# Patient Record
Sex: Female | Born: 1995 | Race: White | Hispanic: No | Marital: Single | Smoking: Never smoker
Health system: Southern US, Community
[De-identification: ages and names within clinical notes are randomized; demographics above are authoritative.]

## PROBLEM LIST (undated history)

## (undated) DIAGNOSIS — F329 Major depressive disorder, single episode, unspecified: Secondary | ICD-10-CM

## (undated) DIAGNOSIS — F32A Depression, unspecified: Secondary | ICD-10-CM

## (undated) DIAGNOSIS — F419 Anxiety disorder, unspecified: Secondary | ICD-10-CM

## (undated) HISTORY — PX: OTHER SURGICAL HISTORY: SHX169

## (undated) HISTORY — DX: Major depressive disorder, single episode, unspecified: F32.9

## (undated) HISTORY — DX: Depression, unspecified: F32.A

## (undated) HISTORY — DX: Anxiety disorder, unspecified: F41.9

---
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
## 2016-02-07 ENCOUNTER — Ambulatory Visit: Payer: BLUE CROSS/BLUE SHIELD | Admitting: Certified Nurse Midwife

## 2016-02-13 ENCOUNTER — Encounter: Payer: Self-pay | Admitting: Certified Nurse Midwife

## 2016-02-13 ENCOUNTER — Ambulatory Visit (INDEPENDENT_AMBULATORY_CARE_PROVIDER_SITE_OTHER): Payer: BLUE CROSS/BLUE SHIELD | Admitting: Certified Nurse Midwife

## 2016-02-13 VITALS — BP 110/62 | HR 80 | Resp 20 | Ht 63.25 in | Wt 160.0 lb

## 2016-02-13 DIAGNOSIS — Z3041 Encounter for surveillance of contraceptive pills: Secondary | ICD-10-CM

## 2016-02-13 DIAGNOSIS — Z01419 Encounter for gynecological examination (general) (routine) without abnormal findings: Secondary | ICD-10-CM | POA: Diagnosis not present

## 2016-02-13 MED ORDER — JUNEL FE 1/20 1-20 MG-MCG PO TABS
1.0000 | ORAL_TABLET | Freq: Every day | ORAL | 4 refills | Status: DC
Start: 2016-02-13 — End: 2017-03-24

## 2016-02-13 NOTE — Progress Notes (Signed)
20 y.o. G0P0000 Single  Caucasian Fe here for annual exam. Periods normal, no issues. Contraception working well. No missed pills or periods. Sexually active, no partner change for 2 years. No STD screening needed. Sees urgent care if needed. No health issues today. 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   Patient's last menstrual period was 01/28/2016 (exact date).          Sexually active: Yes.    The current method of family planning is OCP (estrogen/progesterone).    Exercising: No.  exercise Smoker:  no  Health Maintenance: Pap:  none MMG:  none Colonoscopy:  none BMD:   none TDaP:  2009 Shingles: no Pneumonia: no Hep C and HIV: not done Labs: none Self breast exam: not done   reports that she has never smoked. She has never used smokeless tobacco. She reports that she does not drink alcohol or use drugs.  Past Medical History:  Diagnosis Date  . Anxiety   . Depression     Past Surgical History:  Procedure Laterality Date  . Anxiety    . WISDOM TOOTH EXTRACTION Bilateral 2016    Current Outpatient Prescriptions  Medication Sig Dispense Refill  . JUNEL FE 1/20 1-20 MG-MCG tablet Take 1 tablet by mouth daily. 1 Package 0   No current facility-administered medications for this visit.     Family History  Problem Relation Age of Onset  . Diabetes Maternal Grandmother   . Thyroid disease Paternal Grandmother   . Diabetes Paternal Grandfather   . Stroke Paternal Grandfather     ROS:  Pertinent items are noted in HPI.  Otherwise, a comprehensive ROS was negative.  Exam:   BP 110/62   Pulse 80   Resp 20   Ht 5' 3.25" (1.607 m)   Wt 160 lb (72.6 kg)   LMP 01/28/2016 (Exact Date)   BMI 28.12 kg/m  Height: 5' 3.25" (160.7 cm) Ht Readings from Last 3 Encounters:  02/13/16 5' 3.25" (1.607 m) (34 %, Z= -0.40)*  02/01/15 5' 3.75" (1.619 m) (43 %, Z= -0.18)*  03/27/14 5' 3.5" (1.613 m) (40 %, Z= -0.25)*   * Growth percentiles are based on CDC 2-20  Years data.    General appearance: alert, cooperative and appears stated age Head: Normocephalic, without obvious abnormality, atraumatic Neck: no adenopathy, supple, symmetrical, trachea midline and thyroid normal to inspection and palpation Lungs: clear to auscultation bilaterally Breasts: normal appearance, no masses or tenderness, No nipple retraction or dimpling, No nipple discharge or bleeding, No axillary or supraclavicular adenopathy, Taught monthly breast self examination Heart: regular rate and rhythm Abdomen: soft, non-tender; no masses,  no organomegaly Extremities: extremities normal, atraumatic, no cyanosis or edema Skin: Skin color, texture, turgor normal. No rashes or lesions Lymph nodes: Cervical, supraclavicular, and axillary nodes normal. No abnormal inguinal nodes palpated Neurologic: Grossly normal   Pelvic: External genitalia:  no lesions              Urethra:  normal appearing urethra with no masses, tenderness or lesions              Bartholin's and Skene's: normal                 Vagina: normal appearing vagina with normal color and discharge, no lesions              Cervix: no lesions, nulliparous appearance and normal  Pap taken: No. Bimanual Exam:  Uterus:  normal size, contour, position, consistency, mobility, non-tender and anteverted              Adnexa: normal adnexa and no mass, fullness, tenderness               Rectovaginal: Confirms               Anus:  normal appearance  Chaperone present: yes  A:  Well Woman with normal exam  Contraception OCP desired    P:   Reviewed health and wellness pertinent to exam  Rx Junel 1/20 see order with instructions  Pap smear as above not taken   counseled on breast self exam, STD prevention, HIV risk factors and prevention, use and side effects of OCP's, adequate intake of calcium and vitamin D, diet and exercise  return annually or prn  An After Visit Summary was printed and given to the  patient.

## 2016-02-13 NOTE — Patient Instructions (Signed)
General topics  Next pap or exam is  due in 1 year Take a Women's multivitamin Take 1200 mg. of calcium daily - prefer dietary If any concerns in interim to call back  Breast Self-Awareness Practicing breast self-awareness may pick up problems early, prevent significant medical complications, and possibly save your life. By practicing breast self-awareness, you can become familiar with how your breasts look and feel and if your breasts are changing. This allows you to notice changes early. It can also offer you some reassurance that your breast health is good. One way to learn what is normal for your breasts and whether your breasts are changing is to do a breast self-exam. If you find a lump or something that was not present in the past, it is best to contact your caregiver right away. Other findings that should be evaluated by your caregiver include nipple discharge, especially if it is bloody; skin changes or reddening; areas where the skin seems to be pulled in (retracted); or new lumps and bumps. Breast pain is seldom associated with cancer (malignancy), but should also be evaluated by a caregiver. BREAST SELF-EXAM The best time to examine your breasts is 5 7 days after your menstrual period is over.  ExitCare Patient Information 2013 [employer].   Exercise to Stay Healthy Exercise helps you become and stay healthy. EXERCISE IDEAS AND TIPS Choose exercises that:  You enjoy.  Fit into your day. You do not need to exercise really hard to be healthy. You can do exercises at a slow or medium level and stay healthy. You can:  Stretch before and after working out.  Try yoga, Pilates, or tai chi.  Lift weights.  Walk fast, swim, jog, run, climb stairs, bicycle, dance, or rollerskate.  Take aerobic classes. Exercises that burn about 150 calories:  Running 1  miles in 15 minutes.  Playing volleyball for 45 to 60 minutes.  Washing and waxing a car for 45 to 60  minutes.  Playing touch football for 45 minutes.  Walking 1  miles in 35 minutes.  Pushing a stroller 1  miles in 30 minutes.  Playing basketball for 30 minutes.  Raking leaves for 30 minutes.  Bicycling 5 miles in 30 minutes.  Walking 2 miles in 30 minutes.  Dancing for 30 minutes.  Shoveling snow for 15 minutes.  Swimming laps for 20 minutes.  Walking up stairs for 15 minutes.  Bicycling 4 miles in 15 minutes.  Gardening for 30 to 45 minutes.  Jumping rope for 15 minutes.  Washing windows or floors for 45 to 60 minutes. Document Released: 06/27/2010 Document Revised: 08/17/2011 Document Reviewed: 06/27/2010 ExitCare Patient Information 2013 [employer].   Other topics ( that may be useful information):    Sexually Transmitted Disease Sexually transmitted disease (STD) refers to any infection that is passed from person to person during sexual activity. This may happen by way of saliva, semen, blood, vaginal mucus, or urine. Common STDs include:  Gonorrhea.  Chlamydia.  Syphilis.  HIV/AIDS.  Genital herpes.  Hepatitis B and C.  Trichomonas.  Human papillomavirus (HPV).  Pubic lice. CAUSES  An STD may be spread by bacteria, virus, or parasite. A person can get an STD by:  Sexual intercourse with an infected person.  Sharing sex toys with an infected person.  Sharing needles with an infected person.  Having intimate contact with the genitals, mouth, or rectal areas of an infected person. SYMPTOMS  Some people may not have any symptoms, but   they can still pass the infection to others. Different STDs have different symptoms. Symptoms include:  Painful or bloody urination.  Pain in the pelvis, abdomen, vagina, anus, throat, or eyes.  Skin rash, itching, irritation, growths, or sores (lesions). These usually occur in the genital or anal area.  Abnormal vaginal discharge.  Penile discharge in men.  Soft, flesh-colored skin growths in the  genital or anal area.  Fever.  Pain or bleeding during sexual intercourse.  Swollen glands in the groin area.  Yellow skin and eyes (jaundice). This is seen with hepatitis. DIAGNOSIS  To make a diagnosis, your caregiver may:  Take a medical history.  Perform a physical exam.  Take a specimen (culture) to be examined.  Examine a sample of discharge under a microscope.  Perform blood test TREATMENT   Chlamydia, gonorrhea, trichomonas, and syphilis can be cured with antibiotic medicine.  Genital herpes, hepatitis, and HIV can be treated, but not cured, with prescribed medicines. The medicines will lessen the symptoms.  Genital warts from HPV can be treated with medicine or by freezing, burning (electrocautery), or surgery. Warts may come back.  HPV is a virus and cannot be cured with medicine or surgery.However, abnormal areas may be followed very closely by your caregiver and may be removed from the cervix, vagina, or vulva through office procedures or surgery. If your diagnosis is confirmed, your recent sexual partners need treatment. This is true even if they are symptom-free or have a negative culture or evaluation. They should not have sex until their caregiver says it is okay. HOME CARE INSTRUCTIONS  All sexual partners should be informed, tested, and treated for all STDs.  Take your antibiotics as directed. Finish them even if you start to feel better.  Only take over-the-counter or prescription medicines for pain, discomfort, or fever as directed by your caregiver.  Rest.  Eat a balanced diet and drink enough fluids to keep your urine clear or pale yellow.  Do not have sex until treatment is completed and you have followed up with your caregiver. STDs should be checked after treatment.  Keep all follow-up appointments, Pap tests, and blood tests as directed by your caregiver.  Only use latex condoms and water-soluble lubricants during sexual activity. Do not use  petroleum jelly or oils.  Avoid alcohol and illegal drugs.  Get vaccinated for HPV and hepatitis. If you have not received these vaccines in the past, talk to your caregiver about whether one or both might be right for you.  Avoid risky sex practices that can break the skin. The only way to avoid getting an STD is to avoid all sexual activity.Latex condoms and dental dams (for oral sex) will help lessen the risk of getting an STD, but will not completely eliminate the risk. SEEK MEDICAL CARE IF:   You have a fever.  You have any new or worsening symptoms. Document Released: 08/15/2002 Document Revised: 08/17/2011 Document Reviewed: 08/22/2010 [facility] Patient Information 2013 [employer].    Domestic Abuse You are being battered or abused if someone close to you hits, pushes, or physically hurts you in any way. You also are being abused if you are forced into activities. You are being sexually abused if you are forced to have sexual contact of any kind. You are being emotionally abused if you are made to feel worthless or if you are constantly threatened. It is important to remember that help is available. No one has the right to abuse you. PREVENTION OF FURTHER  ABUSE  Learn the warning signs of danger. This varies with situations but may include: the use of alcohol, threats, isolation from friends and family, or forced sexual contact. Leave if you feel that violence is going to occur.  If you are attacked or beaten, report it to the police so the abuse is documented. You do not have to press charges. The police can protect you while you or the attackers are leaving. Get the officer's name and badge number and a copy of the report.  Find someone you can trust and tell them what is happening to you: your caregiver, a nurse, clergy member, close friend or family member. Feeling ashamed is natural, but remember that you have done nothing wrong. No one deserves abuse. Document Released:  05/22/2000 Document Revised: 08/17/2011 Document Reviewed: 07/31/2010 ExitCare Patient Information 2013 [employer].    How Much is Too Much Alcohol? Drinking too much alcohol can cause injury, accidents, and health problems. These types of problems can include:   Car crashes.  Falls.  Family fighting (domestic violence).  Drowning.  Fights.  Injuries.  Burns.  Damage to certain organs.  Having a baby with birth defects. ONE DRINK CAN BE TOO MUCH WHEN YOU ARE:  Working.  Pregnant or breastfeeding.  Taking medicines. Ask your doctor.  Driving or planning to drive. If you or someone you know has a drinking problem, get help from a doctor.  Document Released: 03/21/2009 Document Revised: 08/17/2011 Document Reviewed: 03/21/2009 ExitCare Patient Information 2013 [employer].   Smoking Hazards Smoking cigarettes is extremely bad for your health. Tobacco smoke has over 200 known poisons in it. There are over 60 chemicals in tobacco smoke that cause cancer. Some of the chemicals found in cigarette smoke include:   Cyanide.  Benzene.  Formaldehyde.  Methanol (wood alcohol).  Acetylene (fuel used in welding torches).  Ammonia. Cigarette smoke also contains the poisonous gases nitrogen oxide and carbon monoxide.  Cigarette smokers have an increased risk of many serious medical problems and Smoking causes approximately:  90% of all lung cancer deaths in men.  80% of all lung cancer deaths in women.  90% of deaths from chronic obstructive lung disease. Compared with nonsmokers, smoking increases the risk of:  Coronary heart disease by 2 to 4 times.  Stroke by 2 to 4 times.  Men developing lung cancer by 23 times.  Women developing lung cancer by 13 times.  Dying from chronic obstructive lung diseases by 12 times.  . Smoking is the most preventable cause of death and disease in our society.  WHY IS SMOKING ADDICTIVE?  Nicotine is the chemical  agent in tobacco that is capable of causing addiction or dependence.  When you smoke and inhale, nicotine is absorbed rapidly into the bloodstream through your lungs. Nicotine absorbed through the lungs is capable of creating a powerful addiction. Both inhaled and non-inhaled nicotine may be addictive.  Addiction studies of cigarettes and spit tobacco show that addiction to nicotine occurs mainly during the teen years, when young people begin using tobacco products. WHAT ARE THE BENEFITS OF QUITTING?  There are many health benefits to quitting smoking.   Likelihood of developing cancer and heart disease decreases. Health improvements are seen almost immediately.  Blood pressure, pulse rate, and breathing patterns start returning to normal soon after quitting. QUITTING SMOKING   American Lung Association - 1-800-LUNGUSA  American Cancer Society - 1-800-ACS-2345 Document Released: 07/02/2004 Document Revised: 08/17/2011 Document Reviewed: 03/06/2009 ExitCare Patient Information 2013 ExitCare,   LLC.   Stress Management Stress is a state of physical or mental tension that often results from changes in your life or normal routine. Some common causes of stress are:  Death of a loved one.  Injuries or severe illnesses.  Getting fired or changing jobs.  Moving into a new home. Other causes may be:  Sexual problems.  Business or financial losses.  Taking on a large debt.  Regular conflict with someone at home or at work.  Constant tiredness from lack of sleep. It is not just bad things that are stressful. It may be stressful to:  Win the lottery.  Get married.  Buy a new car. The amount of stress that can be easily tolerated varies from person to person. Changes generally cause stress, regardless of the types of change. Too much stress can affect your health. It may lead to physical or emotional problems. Too little stress (boredom) may also become stressful. SUGGESTIONS TO  REDUCE STRESS:  Talk things over with your family and friends. It often is helpful to share your concerns and worries. If you feel your problem is serious, you may want to get help from a professional counselor.  Consider your problems one at a time instead of lumping them all together. Trying to take care of everything at once may seem impossible. List all the things you need to do and then start with the most important one. Set a goal to accomplish 2 or 3 things each day. If you expect to do too many in a single day you will naturally fail, causing you to feel even more stressed.  Do not use alcohol or drugs to relieve stress. Although you may feel better for a short time, they do not remove the problems that caused the stress. They can also be habit forming.  Exercise regularly - at least 3 times per week. Physical exercise can help to relieve that "uptight" feeling and will relax you.  The shortest distance between despair and hope is often a good night's sleep.  Go to bed and get up on time allowing yourself time for appointments without being rushed.  Take a short "time-out" period from any stressful situation that occurs during the day. Close your eyes and take some deep breaths. Starting with the muscles in your face, tense them, hold it for a few seconds, then relax. Repeat this with the muscles in your neck, shoulders, hand, stomach, back and legs.  Take good care of yourself. Eat a balanced diet and get plenty of rest.  Schedule time for having fun. Take a break from your daily routine to relax. HOME CARE INSTRUCTIONS   Call if you feel overwhelmed by your problems and feel you can no longer manage them on your own.  Return immediately if you feel like hurting yourself or someone else. Document Released: 11/18/2000 Document Revised: 08/17/2011 Document Reviewed: 07/11/2007 ExitCare Patient Information 2013 ExitCare, [employer].   

## 2016-02-14 NOTE — Progress Notes (Signed)
Encounter reviewed Jill Jertson, MD   

## 2016-04-10 ENCOUNTER — Telehealth: Payer: Self-pay | Admitting: Certified Nurse Midwife

## 2016-04-10 NOTE — Telephone Encounter (Signed)
Patient having pain inside vagina and some on the outside.  Says it feels raw or cut. No rash or redness.  Says " I'm not having sex like that so I don't know what could be causing this".  Says symptoms come and go.

## 2016-04-10 NOTE — Telephone Encounter (Signed)
Spoke with patient. Patient states she has "tiny scrapes" on outside of vagina and feels like "cuts" internally. Patient states this happens intermittently -coming and going for a week at a time. Patient states no pelvic pain but due to the "scapes" intercourse is painful. Patient denies vaginal discharge, odor, bleeding, fever or pelvic pain. Patient states she has been with same partner for 3 years now and last had intercourse 2 weeks ago. Patient states menses have been regular. Patient states this has been going on for last few years. Recommended OV for today for further evaluation, patient declined due to work schedule. Advised patient Melvia Heaps, CNM out of office 04/13/16, can schedule with covering provider; patient scheduled with Dr. Quincy Simmonds at 11:30am. Advised patient should symptoms worsen to seek care at ER/urgent care, patient verbalizes understanding and is agreeable.   Melvia Heaps, CNM do you agree with recommendations?   Cc: Dr. Quincy Simmonds

## 2016-04-10 NOTE — Telephone Encounter (Signed)
agree

## 2016-04-13 ENCOUNTER — Encounter: Payer: Self-pay | Admitting: Obstetrics and Gynecology

## 2016-04-13 ENCOUNTER — Ambulatory Visit (INDEPENDENT_AMBULATORY_CARE_PROVIDER_SITE_OTHER): Payer: BLUE CROSS/BLUE SHIELD | Admitting: Obstetrics and Gynecology

## 2016-04-13 VITALS — BP 106/70 | HR 68 | Temp 98.5°F | Resp 16 | Ht 63.75 in | Wt 155.0 lb

## 2016-04-13 DIAGNOSIS — N762 Acute vulvitis: Secondary | ICD-10-CM | POA: Diagnosis not present

## 2016-04-13 NOTE — Progress Notes (Addendum)
GYNECOLOGY  VISIT   HPI: 20 y.o.   Single  Caucasian  female   G0P0000 with Patient's last menstrual period was 03/31/2016.   here for vaginal lesions, vaginal irritation, raw places, vaginal pain since 1 year per patient.  Symptoms for one year.  Shows me a picture from her cell phone.  (Looks like potential HSV lesions. ) States she has been testing in the past and had been negative for this.   Has raw places occur externally and it hurts to have sex during this time.  Occurs at least once a month and they last for one week or more.  "It hurts terribly."  Sometimes itching.  No odor.  Denies inguinal pain.   No change in soaps or detergents.   No change in partners.   No condom use.   No recurrent use of abx.  Menses regular once a month.   GYNECOLOGIC HISTORY: Patient's last menstrual period was 03/31/2016. Contraception:  JUNEL FE  Menopausal hormone therapy:  n/a Last mammogram:  n/a Last pap smear:   n/a        OB History    Gravida Para Term Preterm AB Living   0 0 0 0 0 0   SAB TAB Ectopic Multiple Live Births   0 0 0 0           There are no active problems to display for this patient.   Past Medical History:  Diagnosis Date  . Anxiety   . Depression     Past Surgical History:  Procedure Laterality Date  . Anxiety    . WISDOM TOOTH EXTRACTION Bilateral 2016    Current Outpatient Prescriptions  Medication Sig Dispense Refill  . JUNEL FE 1/20 1-20 MG-MCG tablet Take 1 tablet by mouth daily. 3 Package 4   No current facility-administered medications for this visit.      ALLERGIES: Patient has no known allergies.  Family History  Problem Relation Age of Onset  . Diabetes Maternal Grandmother   . Thyroid disease Paternal Grandmother   . Diabetes Paternal Grandfather   . Stroke Paternal Grandfather     Social History   Social History  . Marital status: Single    Spouse name: N/A  . Number of children: N/A  . Years of education: N/A    Occupational History  . Not on file.   Social History Main Topics  . Smoking status: Never Smoker  . Smokeless tobacco: Never Used  . Alcohol use No  . Drug use: No  . Sexual activity: Yes    Partners: Male    Birth control/ protection: Pill   Other Topics Concern  . Not on file   Social History Narrative  . No narrative on file    ROS:  Pertinent items are noted in HPI.  PHYSICAL EXAMINATION:    BP 106/70 (BP Location: Right Arm, Patient Position: Sitting, Cuff Size: Normal)   Pulse 68   Temp 98.5 F (36.9 C) (Oral)   Resp 16   Ht 5' 3.75" (1.619 m)   Wt 155 lb (70.3 kg)   LMP 03/31/2016   BMI 26.81 kg/m     General appearance: alert, cooperative and appears stated age    Pelvic: External genitalia:   Erythema of the vulva - labia minor and minora.  Subtle lesion of the left inferior labia majora/minora border.               Urethra:  normal appearing urethra with no  masses, tenderness or lesions              Bartholins and Skenes: normal                 Vagina: normal appearing vagina with normal color and discharge, no lesions              Cervix: no lesions                Bimanual Exam:  Uterus:  normal size, contour, position, consistency, mobility, non-tender              Adnexa: no mass, fullness, tenderness   Chaperone was present for exam.  ASSESSMENT  Vulvitis.  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  May have superimposed yeast infection.   PLAN  Affirm.  HSV culture done.  HSV I and II IgG.  She will return to do this as the lab is closed.  We discussed treatment for yeast and HSV, but she wants to wait for the tet results to return.  We talked about daily antiviral medication to suppress outbreaks.  If patient is positive, I would recommend full STD testing and partner to be tested as well.     An After Visit Summary was printed and given to the patient.  _15_____ minutes face to face time of which over 50% was spent in counseling.

## 2016-04-13 NOTE — Addendum Note (Signed)
Addended by: Yisroel Ramming, Dietrich Pates E on: 04/13/2016 12:23 PM   Modules accepted: Orders

## 2016-04-14 ENCOUNTER — Telehealth: Payer: Self-pay | Admitting: *Deleted

## 2016-04-14 ENCOUNTER — Other Ambulatory Visit (INDEPENDENT_AMBULATORY_CARE_PROVIDER_SITE_OTHER): Payer: BLUE CROSS/BLUE SHIELD

## 2016-04-14 DIAGNOSIS — N762 Acute vulvitis: Secondary | ICD-10-CM

## 2016-04-14 LAB — WET PREP BY MOLECULAR PROBE
Candida species: POSITIVE — AB
Gardnerella vaginalis: NEGATIVE
TRICHOMONAS VAG: NEGATIVE

## 2016-04-14 MED ORDER — FLUCONAZOLE 150 MG PO TABS: ORAL_TABLET | ORAL | 0 refills | Status: DC

## 2016-04-14 NOTE — Telephone Encounter (Signed)
-----   Message from Nunzio Cobbs, MD sent at 04/14/2016 12:04 PM EST ----- Please let patient know that she does have a yeast infection.  I am recommending Diflucan 150 mg po x 1 and repeat in 72 hours prn.  Dispense - 2, RF none.   HSV culture and serum testing is pending.   Cc- Sagal Sprinkles

## 2016-04-14 NOTE — Telephone Encounter (Signed)
Left message to call Issis Lindseth at [phone].  

## 2016-04-14 NOTE — Telephone Encounter (Signed)
Spoke with patient, advised as seen below per Dr. Elza Rafter request. Patient verbalizes understanding and is agreeable. New order placed for Diflucan 150mg  #2/0RF.   Routing to provider for final review. Patient is agreeable to disposition. Will close encounter.

## 2016-04-15 ENCOUNTER — Encounter: Payer: Self-pay | Admitting: Obstetrics and Gynecology

## 2016-04-15 LAB — HSV(HERPES SIMPLEX VRS) I + II AB-IGG: HSV 2 Glycoprotein G Ab, IgG: <0.9 Index (ref <0.90)

## 2016-04-15 LAB — HERPES SIMPLEX VIRUS CULTURE: Organism ID, Bacteria: NOT DETECTED

## 2016-04-16 ENCOUNTER — Telehealth: Payer: Self-pay | Admitting: Obstetrics and Gynecology

## 2016-04-16 NOTE — Telephone Encounter (Signed)
Patient says she is returning a call to [place].(No open tc note)

## 2016-04-16 NOTE — Telephone Encounter (Signed)
Returned call to patient to review recent lab results -see note on 04/14/16 lab report. Will close encounter.

## 2016-07-16 ENCOUNTER — Telehealth: Payer: Self-pay | Admitting: Certified Nurse Midwife

## 2016-07-16 NOTE — Telephone Encounter (Signed)
Please schedule an appointment for patient to see Evalee Mutton. I am happy to see her if Jackelyn Poling is not available.  [place].

## 2016-07-16 NOTE — Telephone Encounter (Signed)
Patient has been seeing brown blood for 2 weeks before her period starts.

## 2016-07-16 NOTE — Telephone Encounter (Signed)
Spoke with patient. Patient states she has been having a brown discharge that she is not sure is discharge or blood for the last 2 weeks. Patient is on OCP and was due to start cycle on Tuesday, but has not started. Patient states she usually starts a new pack of OCP on Monday following cycle. Patient reports she took one pill out of new pack on Monday not thinking since she was having the brown discharge and realized still no cycle so she has not taken anymore OCP. Patient states she has taken 3 UPT today and reports all negative. Patient denies pain, other than normal menstrual cramps. Patient denies odor, urinary complaints or STD concerns. Patient states no partner changes since last OV. Patient states she has not had any vaginal lesions since last OV. Advised patient would review with Dr. Quincy Simmonds and return call with recommendations, patient is agreeable.   Dr. Quincy Simmonds -please advise?

## 2016-07-17 NOTE — Telephone Encounter (Signed)
Call to patient. Advised Dr Quincy Simmonds reviewed call and recommends office visit. Patient agreeable and requests appointment next Thursday. Advised would prefer she be seen earlier than Thursday if possible. Patient agreeable to Tuesday am but states she absolutely cannot come today or Monday due to class and work.  Advised if bleeding gets worse and becomes heavy or develops pain to call back to office (if open) or go to urgent care, [facility], or  nearest ED for evaluation. Appointment scheduled for Tuesday 07-21-16 at 930 with French Ana, CNM.  Encounter closed.  CC: French Ana, CNM

## 2016-07-21 ENCOUNTER — Ambulatory Visit (INDEPENDENT_AMBULATORY_CARE_PROVIDER_SITE_OTHER): Payer: BLUE CROSS/BLUE SHIELD | Admitting: Certified Nurse Midwife

## 2016-07-21 ENCOUNTER — Encounter: Payer: Self-pay | Admitting: Certified Nurse Midwife

## 2016-07-21 VITALS — BP 110/70 | HR 60 | Resp 12 | Ht 63.75 in | Wt 150.6 lb

## 2016-07-21 DIAGNOSIS — N926 Irregular menstruation, unspecified: Secondary | ICD-10-CM

## 2016-07-21 DIAGNOSIS — N898 Other specified noninflammatory disorders of vagina: Secondary | ICD-10-CM | POA: Diagnosis not present

## 2016-07-21 LAB — POCT URINE PREGNANCY: Preg Test, Ur: NEGATIVE

## 2016-07-21 NOTE — Patient Instructions (Signed)
Oral Contraception Use Oral contraceptive pills (OCPs) are medicines taken to prevent pregnancy. OCPs work by preventing the ovaries from releasing eggs. The hormones in OCPs also cause the cervical mucus to thicken, preventing the sperm from entering the uterus. The hormones also cause the uterine lining to become thin, not allowing a fertilized egg to attach to the inside of the uterus. OCPs are highly effective when taken exactly as prescribed. However, OCPs do not prevent sexually transmitted diseases (STDs). Safe sex practices, such as using condoms along with an OCP, can help prevent STDs. Before taking OCPs, you may have a physical exam and Pap test. Your health care provider may also order blood tests if necessary. Your health care provider will make sure you are a good candidate for oral contraception. Discuss with your health care provider the possible side effects of the OCP you may be prescribed. When starting an OCP, it can take 2 to 3 months for the body to adjust to the changes in hormone levels in your body. How to take oral contraceptive pills Your health care provider may advise you on how to start taking the first cycle of OCPs. Otherwise, you can:  Start on day 1 of your menstrual period. You will not need any backup contraceptive protection with this start time.  Start on the first Sunday after your menstrual period or the day you get your prescription. In these cases, you will need to use backup contraceptive protection for the first week.  Start the pill at any time of your cycle. If you take the pill within 5 days of the start of your period, you are protected against pregnancy right away. In this case, you will not need a backup form of birth control. If you start at any other time of your menstrual cycle, you will need to use another form of birth control for 7 days. If your OCP is the type called a minipill, it will protect you from pregnancy after taking it for 2 days (48  hours).  After you have started taking OCPs:  If you forget to take 1 pill, take it as soon as you remember. Take the next pill at the regular time.  If you miss 2 or more pills, call your health care provider because different pills have different instructions for missed doses. Use backup birth control until your next menstrual period starts.  If you use a 28-day pack that contains inactive pills and you miss 1 of the last 7 pills (pills with no hormones), it will not matter. Throw away the rest of the non-hormone pills and start a new pill pack.  No matter which day you start the OCP, you will always start a new pack on that same day of the week. Have an extra pack of OCPs and a backup contraceptive method available in case you miss some pills or lose your OCP pack. Follow these instructions at home:  Do not smoke.  Always use a condom to protect against STDs. OCPs do not protect against STDs.  Use a calendar to mark your menstrual period days.  Read the information and directions that came with your OCP. Talk to your health care provider if you have questions. Contact a health care provider if:  You develop nausea and vomiting.  You have abnormal vaginal discharge or bleeding.  You develop a rash.  You miss your menstrual period.  You are losing your hair.  You need treatment for mood swings or depression.  You   get dizzy when taking the OCP.  You develop acne from taking the OCP.  You become pregnant. Get help right away if:  You develop chest pain.  You develop shortness of breath.  You have an uncontrolled or severe headache.  You develop numbness or slurred speech.  You develop visual problems.  You develop pain, redness, and swelling in the legs. This information is not intended to replace advice given to you by your health care provider. Make sure you discuss any questions you have with your health care provider. Document Released: 05/14/2011 Document  Revised: 10/31/2015 Document Reviewed: 11/13/2012 Elsevier Interactive Patient Education  2017 [employer]  

## 2016-07-21 NOTE — Progress Notes (Signed)
Encounter reviewed Jill Jertson, MD   

## 2016-07-21 NOTE — Progress Notes (Signed)
21 y.o. Single Caucasian female G0P0000 here with complaint of ?break through bleeding with OCP's Some brownish discharge with wiping about a week before menses was to start, which only a few days of spotting prior is normal for her.. Increase red color when menses actually started, but not excessive. No cramping or vaginal odor.Consistent pill use. Started first pill of pack with early onset and realized not time for period, so needs to start OCP now .Onset of symptoms 2 weeks ago. Denies vaginal symptoms or pain with sexual activity.No STD concerns. Urinary symptoms, frequency but on weight loss and drinking more.. Patient states has been drinking a lot of coffee lately due to coffee maker, also is now exercising regular with 10 pound weight loss.. Contraception is Junel 1/20 with no change in generic.No STD concerns  UPT: Negative  O:  Healthy female WDWN Affect: normal, orientation x 3  Exam:Skin: warm and dry Thyroid: normal, no enlargement Abdomen:not tender, soft.  Inguinal Lymph nodes: no enlargement or tenderness Pelvic exam: External genital: normal female BUS: negative Vagina: normal discharge.  Affirm taken. Cervix: normal, non tender, no CMT, no blood present from cervix noted Uterus: normal, non tender Adnexa:normal, non tender, no masses or fullness noted    A:? BTB with Junel 1/20 Fe vs cycle change with weight change. Normal pelvic exam R/O vaginal infection   P:Discussed findings of normal pelvic exam and maybe related to just change in cycle regarding weight change and one pill out pack use early also. Discussed starting OCP today, one day late and use BUM with condoms for the rest of the month. Continue menses calendar, if occurs again next month, may need to change dose of OCP for better endometrial support..Questions addressed. Patient will advise. Lab: Affirm Will treat if indicated.  Rv prn

## 2016-07-22 ENCOUNTER — Telehealth: Payer: Self-pay

## 2016-07-22 LAB — WET PREP BY MOLECULAR PROBE
CANDIDA SPECIES: NEGATIVE
GARDNERELLA VAGINALIS: POSITIVE — AB
TRICHOMONAS VAG: NEGATIVE

## 2016-07-22 MED ORDER — METRONIDAZOLE 0.75 % VA GEL: 1.0000 | Freq: Two times a day (BID) | VAGINAL | 0 refills | Status: AC

## 2016-07-22 NOTE — Telephone Encounter (Signed)
-----   Message from Regina Eck, CNM sent at 07/22/2016  2:52 PM EST ----- Notify patient that her wet prep was negative for yeast, trichomonas. Positive for BV which could contribute to spotting. Rx Metrogel bid x 5 days. No refills

## 2016-07-22 NOTE — Telephone Encounter (Signed)
Per Jackelyn Poling, Metrogel bid x5 called into pharmacy. Patient verified pharmacy on file.

## 2016-08-13 ENCOUNTER — Ambulatory Visit (INDEPENDENT_AMBULATORY_CARE_PROVIDER_SITE_OTHER): Payer: BLUE CROSS/BLUE SHIELD | Admitting: Certified Nurse Midwife

## 2016-08-13 ENCOUNTER — Encounter: Payer: Self-pay | Admitting: Certified Nurse Midwife

## 2016-08-13 ENCOUNTER — Telehealth: Payer: Self-pay | Admitting: Certified Nurse Midwife

## 2016-08-13 VITALS — BP 100/64 | HR 60 | Resp 16 | Ht 63.75 in | Wt 148.0 lb

## 2016-08-13 DIAGNOSIS — B3731 Acute candidiasis of vulva and vagina: Secondary | ICD-10-CM

## 2016-08-13 DIAGNOSIS — B373 Candidiasis of vulva and vagina: Secondary | ICD-10-CM | POA: Diagnosis not present

## 2016-08-13 MED ORDER — NYSTATIN-TRIAMCINOLONE 100000-0.1 UNIT/GM-% EX OINT: 1.0000 "application " | TOPICAL_OINTMENT | Freq: Two times a day (BID) | CUTANEOUS | 0 refills | Status: DC

## 2016-08-13 NOTE — Telephone Encounter (Signed)
The patient was instructed if she thought partner had issues with yeast there were over the counter options. She should have him follow OTC directions. I can not treat him, because he is not a patient here.

## 2016-08-13 NOTE — Telephone Encounter (Signed)
Left detailed message, ok per current dpr. Advised as seen below per Melvia Heaps, CNM. Advised patient to return call to office with any additional questions/concerns.  Routing to provider for final review. Patient is agreeable to disposition. Will close encounter.

## 2016-08-13 NOTE — Progress Notes (Signed)
Encounter reviewed Garnette Greb, MD   

## 2016-08-13 NOTE — Telephone Encounter (Signed)
Sonya Edwards, CNM- please advise on lotrimin AF for partner.

## 2016-08-13 NOTE — Progress Notes (Signed)
21 y.o. Single Caucasian female [id number] here with complaint of vaginal symptoms of cuts on external area of vulva and at the bottom of vaginal opening. "they will occur and feel sore for a few days and then will heal." No new personal products or partner change. Partner not complaining of any issues. Has this off and on for one year. Has been tested for HSV twice in 2015 and 2017 due to appearance and was negative. No change in vaginal symptoms. Patient does wear thongs, but no underwear at night. No oral sexual activity or hand stimulation. Used coconut oil after last visit after discussion with me regarding dryness. This helped with the internal symptoms.. No STD concerns. Urinary symptoms none . Contraception is OCP. No other health concerns today.  ROS  Pertinent to HPI  O:Healthy female WDWN Affect: normal, orientation x 3  Exam:Skin: warm and dry Abdomen: soft, non tender  Inguinal  Lymph nodes: no enlargement or tenderness Pelvic exam: External genital: normal female, with increase pink around lower 1/3 of vulva, no lesions or blisters, small superficial cracking noted on area below introitus, slightly tender to touch. Slight exudate noted.wet prep taken. BUS: negative Vagina: white slightly odorous discharge noted. , Affirm taken Cervix: normal, non tender, no CMT Uterus: normal, non tender Adnexa:normal, non tender, no masses or fullness noted   Wet Prep results: external area only KOH, Saline + yeast   A:Normal pelvic exam Yeast vulvitis Skin dryness and ? Thong irritation by appearance R/O vaginal infection   P:Discussed findings of yeast vulvitis and etiology. Discussed Aveeno or baking soda sitz bath for comfort. Avoid moist clothes or pads for extended period of time. If working out in gym clothes  long periods of time change underwear  if possible. Discussed avoiding thongs until this area has healed and non tender. Would also avoid sexual activity until healed. Coconut Oil  use for skin protection prior to activity can be used to external skin for protection or dryness. Rx Nystatin/Trimincinolone ointment bid x 5-7 days. Avoid over use due to skin thinning. Once completed use coconut oil to area for moisture and protection.  Lab: affirm will treat if indicated  Re check 2 weeks if no change  Rv prn

## 2016-08-13 NOTE — Telephone Encounter (Signed)
Patient saw Melvia Heaps this morning and was told to have her boyfriend use Lotrimin Af.  Wants to know how often he should use it.

## 2016-08-14 ENCOUNTER — Telehealth: Payer: Self-pay

## 2016-08-14 ENCOUNTER — Other Ambulatory Visit: Payer: Self-pay

## 2016-08-14 LAB — WET PREP BY MOLECULAR PROBE
Candida species: DETECTED — AB
Gardnerella vaginalis: NOT DETECTED
Trichomonas vaginosis: NOT DETECTED

## 2016-08-14 MED ORDER — FLUCONAZOLE 150 MG PO TABS: ORAL_TABLET | ORAL | 0 refills | Status: DC

## 2016-08-14 NOTE — Telephone Encounter (Signed)
Patient returned your call.

## 2016-08-14 NOTE — Telephone Encounter (Signed)
Tried to call patient. Try call again.

## 2016-08-14 NOTE — Telephone Encounter (Signed)
-----   Message from Regina Eck, CNM sent at 08/14/2016  7:55 AM EST ----- Notify patient that wet prep was positive for yeast, which we were aware and negative for Bv and trichomonas. Due to vaginal yeast present, also needs Rx Diflucan one today and repeat one in 5 days. Patient status with external medication

## 2016-08-14 NOTE — Telephone Encounter (Signed)
Patient notified of results as written by provider 

## 2016-08-27 ENCOUNTER — Encounter: Payer: Self-pay | Admitting: Certified Nurse Midwife

## 2016-08-27 ENCOUNTER — Ambulatory Visit (INDEPENDENT_AMBULATORY_CARE_PROVIDER_SITE_OTHER): Payer: BLUE CROSS/BLUE SHIELD | Admitting: Certified Nurse Midwife

## 2016-08-27 VITALS — BP 104/64 | HR 60 | Resp 16 | Ht 63.75 in | Wt 146.0 lb

## 2016-08-27 DIAGNOSIS — B373 Candidiasis of vulva and vagina: Secondary | ICD-10-CM

## 2016-08-27 DIAGNOSIS — B3731 Acute candidiasis of vulva and vagina: Secondary | ICD-10-CM

## 2016-08-27 NOTE — Progress Notes (Signed)
21 y.o. Single Caucasian female [id number] here for follow up of yeast vaginitis/vulvitis with superficial lacerations treated with nystatin/trimacinolone ointment and Diflucan initiated on 08/13/16. Patient completed all medication as directed.  Denies any symptoms of itching, pain on vulva or fourchette area. No cuts or skin separation as before. "Feels so much better". Has not been sexually active due to increased occurrence of yeast infection. Partner to treat for external yeast prior to sexual activity again. Contraception OCP/ condoms. No other health issues today.  ROS: Pertinent to HPI   O: Healthy WD,WN female Affect: normal Skin:warm and dry Inguinal lymph nodes not enlarged or tender Pelvic exam:EXTERNAL GENITALIA: normal appearing vulva with no masses, tenderness or lesions, no superficial lacerations noted externally, wet prep taken VAGINA: no abnormal discharge or lesions and Wet Prep taken, Ph 4.0 CERVIX: no lesions, tenderness, normal appearance Declined pelvic exam  KOH,Saline: Negative for pathogens external or vaginal  A:Yeast vaginitis/vulvitis resolved Superficial lacerations of perineal area healed   P: Discussed findings of normal exam and all yeast appears to be resolved. Discussed coconut oil use prior to sexual activity and on outside of perineal area. Patient plans to do this. Questions addressed.  RV prn

## 2016-08-30 NOTE — Progress Notes (Signed)
Encounter reviewed Jill Jertson, MD   

## 2017-02-09 ENCOUNTER — Telehealth: Payer: Self-pay | Admitting: Certified Nurse Midwife

## 2017-02-09 NOTE — Telephone Encounter (Signed)
Patient states that she is on birth control pill and having break through bleeding.  Wants to know if that is normal.

## 2017-02-09 NOTE — Telephone Encounter (Signed)
Spoke with patient. Reports taking Junel Fe for last 2.5 years, recently experiencing BTB and heavier menses. Reports for the last 2 months starting menses 1.5 weeks before placebo pills, flow is heavier than her normal cycle and increased cramping. Changes super tampon q3-4 hours and reports cramping 5-6/10 "at its worst".   Reports no missed pills, takes same time every day. Denies fatigue, dizziness, weakness or increased HR. SA, no partner changes or concerns. LMP 01/26/17. Last OV 08/27/16.  Recommended OV for further evaluation. Patient declined OV for today, scheduled for 9/5 at 4pm with Melvia Heaps, CNM. Advised patient would review with provider and return call with any additional recommendations. Patient verbalizes understanding and is agreeable.  Routing to provider for final review. Patient is agreeable to disposition. Will close encounter.

## 2017-02-10 ENCOUNTER — Encounter: Payer: Self-pay | Admitting: Certified Nurse Midwife

## 2017-02-10 ENCOUNTER — Ambulatory Visit (INDEPENDENT_AMBULATORY_CARE_PROVIDER_SITE_OTHER): Payer: BLUE CROSS/BLUE SHIELD | Admitting: Certified Nurse Midwife

## 2017-02-10 VITALS — BP 90/62 | HR 64 | Resp 16 | Ht 63.75 in | Wt 136.0 lb

## 2017-02-10 DIAGNOSIS — N938 Other specified abnormal uterine and vaginal bleeding: Secondary | ICD-10-CM

## 2017-02-10 DIAGNOSIS — R102 Pelvic and perineal pain: Secondary | ICD-10-CM | POA: Diagnosis not present

## 2017-02-10 DIAGNOSIS — N926 Irregular menstruation, unspecified: Secondary | ICD-10-CM | POA: Diagnosis not present

## 2017-02-10 LAB — POCT URINE PREGNANCY: Preg Test, Ur: NEGATIVE

## 2017-02-10 NOTE — Patient Instructions (Signed)
Oral Contraception Use Oral contraceptive pills (OCPs) are medicines taken to prevent pregnancy. OCPs work by preventing the ovaries from releasing eggs. The hormones in OCPs also cause the cervical mucus to thicken, preventing the sperm from entering the uterus. The hormones also cause the uterine lining to become thin, not allowing a fertilized egg to attach to the inside of the uterus. OCPs are highly effective when taken exactly as prescribed. However, OCPs do not prevent sexually transmitted diseases (STDs). Safe sex practices, such as using condoms along with an OCP, can help prevent STDs. Before taking OCPs, you may have a physical exam and Pap test. Your health care provider may also order blood tests if necessary. Your health care provider will make sure you are a good candidate for oral contraception. Discuss with your health care provider the possible side effects of the OCP you may be prescribed. When starting an OCP, it can take 2 to 3 months for the body to adjust to the changes in hormone levels in your body. How to take oral contraceptive pills Your health care provider may advise you on how to start taking the first cycle of OCPs. Otherwise, you can:  Start on day 1 of your menstrual period. You will not need any backup contraceptive protection with this start time.  Start on the first Sunday after your menstrual period or the day you get your prescription. In these cases, you will need to use backup contraceptive protection for the first week.  Start the pill at any time of your cycle. If you take the pill within 5 days of the start of your period, you are protected against pregnancy right away. In this case, you will not need a backup form of birth control. If you start at any other time of your menstrual cycle, you will need to use another form of birth control for 7 days. If your OCP is the type called a minipill, it will protect you from pregnancy after taking it for 2 days (48  hours).  After you have started taking OCPs:  If you forget to take 1 pill, take it as soon as you remember. Take the next pill at the regular time.  If you miss 2 or more pills, call your health care provider because different pills have different instructions for missed doses. Use backup birth control until your next menstrual period starts.  If you use a 28-day pack that contains inactive pills and you miss 1 of the last 7 pills (pills with no hormones), it will not matter. Throw away the rest of the non-hormone pills and start a new pill pack.  No matter which day you start the OCP, you will always start a new pack on that same day of the week. Have an extra pack of OCPs and a backup contraceptive method available in case you miss some pills or lose your OCP pack. Follow these instructions at home:  Do not smoke.  Always use a condom to protect against STDs. OCPs do not protect against STDs.  Use a calendar to mark your menstrual period days.  Read the information and directions that came with your OCP. Talk to your health care provider if you have questions. Contact a health care provider if:  You develop nausea and vomiting.  You have abnormal vaginal discharge or bleeding.  You develop a rash.  You miss your menstrual period.  You are losing your hair.  You need treatment for mood swings or depression.  You   get dizzy when taking the OCP.  You develop acne from taking the OCP.  You become pregnant. Get help right away if:  You develop chest pain.  You develop shortness of breath.  You have an uncontrolled or severe headache.  You develop numbness or slurred speech.  You develop visual problems.  You develop pain, redness, and swelling in the legs. This information is not intended to replace advice given to you by your health care provider. Make sure you discuss any questions you have with your health care provider. Document Released: 05/14/2011 Document  Revised: 10/31/2015 Document Reviewed: 11/13/2012 Elsevier Interactive Patient Education  2017 [employer]  

## 2017-02-10 NOTE — Progress Notes (Signed)
Subjective:     Patient ID: Sonya Edwards, female   DOB: [birth year], 21 y.o.   MRN: [id number]  21 yo here with complaint of bleeding two weeks into birth control pill pack and again on placebo week. Admits to missing 2 pills due to ? Period coming early. This has occurred for the past two months. Last sexual activity was in 01/05/17. Happy with OCP choice, but not with the bleeding, which was slightly heavier this month. Now on 2nd week of current pill pack. Patient also concerned with continued discomfort with sexual activity. No partner change. Has stopped having cracking of skin with coconut oil use with sexual activity. She notes cramping at times with sexual activity and was concerned something is wrong "inside of me". Partner very patient and does stop if she is uncomfortable. Partner is average size and no pain with missionary position, only with deeper penetration. No STD concerns, but agreeable if testing needed. Denies vaginal itching, burning or increase discharge or new personal products. No other health issues today.      Review of Systems  Constitutional: Negative.   Gastrointestinal: Negative.   Genitourinary: Negative for vaginal pain.       With sexual activity only  Skin: Negative.   Psychiatric/Behavioral: Negative.        Objective:   Physical Exam  Constitutional: She is oriented to person, place, and time. She appears well-developed and well-nourished.  Abdominal: Soft. She exhibits no mass. There is no tenderness. There is no rebound and no guarding.  Genitourinary: Uterus normal. There is no rash, tenderness, lesion or injury on the right labia. There is no rash, tenderness, lesion or injury on the left labia. Cervix exhibits friability. Cervix exhibits no motion tenderness and no discharge. Right adnexum displays no mass, no tenderness and no fullness. Left adnexum displays no mass, no tenderness and no fullness. There is tenderness in the vagina. Vaginal discharge  found.  Genitourinary Comments: Scant blood present. Specimens collected. Cervix slightly tender  Lymphadenopathy:       Right: No inguinal adenopathy present.       Left: No inguinal adenopathy present.  Neurological: She is alert and oriented to person, place, and time.  Skin: Skin is warm and dry.  Psychiatric: She has a normal mood and affect. Her behavior is normal. Judgment and thought content normal.       Assessment:     Normal pelvic exam BTB with current OCP, but missed pills Pelvic pain with sexual activity, R/O infection    Plan:     Discussed normal pelvic exam finding and no findings of anything wrong. Discussed stimulation of cervix and pressure against cervix can cause cramping which is considered normal. Encouraged to use what position is comfortable for her. Discussed coconut oil for vaginal lubricant also and this may resolve any tenderness with sexual activity. Questions addressed and patient feels much better about her concerns. Will advise if has other issues. Discussed BTB can occur with missed pills or not. Discussed complete current pack and keep menses record if BTB happens again. Patient would like to complete this pack and one other she has. If resolves does not want to change OCP. Patient will advise. Labs: Affirm, GC,Chlamydia Will treat if indicated.  Rv prn

## 2017-02-11 LAB — GC/CHLAMYDIA PROBE AMP
Chlamydia trachomatis, NAA: NEGATIVE
Neisseria gonorrhoeae by PCR: NEGATIVE

## 2017-02-11 LAB — VAGINITIS/VAGINOSIS, DNA PROBE
Candida Species: NEGATIVE
GARDNERELLA VAGINALIS: NEGATIVE
TRICHOMONAS VAG: NEGATIVE

## 2017-02-17 ENCOUNTER — Ambulatory Visit: Payer: BLUE CROSS/BLUE SHIELD | Admitting: Certified Nurse Midwife

## 2017-03-24 ENCOUNTER — Encounter: Payer: Self-pay | Admitting: Certified Nurse Midwife

## 2017-03-24 ENCOUNTER — Ambulatory Visit (INDEPENDENT_AMBULATORY_CARE_PROVIDER_SITE_OTHER): Payer: BLUE CROSS/BLUE SHIELD | Admitting: Certified Nurse Midwife

## 2017-03-24 VITALS — BP 100/58 | HR 60 | Resp 12 | Ht 64.0 in | Wt 136.0 lb

## 2017-03-24 DIAGNOSIS — Z30011 Encounter for initial prescription of contraceptive pills: Secondary | ICD-10-CM | POA: Diagnosis not present

## 2017-03-24 DIAGNOSIS — N938 Other specified abnormal uterine and vaginal bleeding: Secondary | ICD-10-CM

## 2017-03-24 DIAGNOSIS — Z01419 Encounter for gynecological examination (general) (routine) without abnormal findings: Secondary | ICD-10-CM

## 2017-03-24 MED ORDER — NORETHIN ACE-ETH ESTRAD-FE 1.5-30 MG-MCG PO TABS: 1.0000 | ORAL_TABLET | Freq: Every day | ORAL | 11 refills | Status: DC

## 2017-03-24 NOTE — Patient Instructions (Signed)
General topics  Next pap or exam is  due in 1 year Take a Women's multivitamin Take 1200 mg. of calcium daily - prefer dietary If any concerns in interim to call back  Breast Self-Awareness Practicing breast self-awareness may pick up problems early, prevent significant medical complications, and possibly save your life. By practicing breast self-awareness, you can become familiar with how your breasts look and feel and if your breasts are changing. This allows you to notice changes early. It can also offer you some reassurance that your breast health is good. One way to learn what is normal for your breasts and whether your breasts are changing is to do a breast self-exam. If you find a lump or something that was not present in the past, it is best to contact your caregiver right away. Other findings that should be evaluated by your caregiver include nipple discharge, especially if it is bloody; skin changes or reddening; areas where the skin seems to be pulled in (retracted); or new lumps and bumps. Breast pain is seldom associated with cancer (malignancy), but should also be evaluated by a caregiver. BREAST SELF-EXAM The best time to examine your breasts is 5 7 days after your menstrual period is over.  ExitCare Patient Information 2013 [employer].   Exercise to Stay Healthy Exercise helps you become and stay healthy. EXERCISE IDEAS AND TIPS Choose exercises that:  You enjoy.  Fit into your day. You do not need to exercise really hard to be healthy. You can do exercises at a slow or medium level and stay healthy. You can:  Stretch before and after working out.  Try yoga, Pilates, or tai chi.  Lift weights.  Walk fast, swim, jog, run, climb stairs, bicycle, dance, or rollerskate.  Take aerobic classes. Exercises that burn about 150 calories:  Running 1  miles in 15 minutes.  Playing volleyball for 45 to 60 minutes.  Washing and waxing a car for 45 to 60  minutes.  Playing touch football for 45 minutes.  Walking 1  miles in 35 minutes.  Pushing a stroller 1  miles in 30 minutes.  Playing basketball for 30 minutes.  Raking leaves for 30 minutes.  Bicycling 5 miles in 30 minutes.  Walking 2 miles in 30 minutes.  Dancing for 30 minutes.  Shoveling snow for 15 minutes.  Swimming laps for 20 minutes.  Walking up stairs for 15 minutes.  Bicycling 4 miles in 15 minutes.  Gardening for 30 to 45 minutes.  Jumping rope for 15 minutes.  Washing windows or floors for 45 to 60 minutes. Document Released: 06/27/2010 Document Revised: 08/17/2011 Document Reviewed: 06/27/2010 ExitCare Patient Information 2013 [employer].   Other topics ( that may be useful information):    Sexually Transmitted Disease Sexually transmitted disease (STD) refers to any infection that is passed from person to person during sexual activity. This may happen by way of saliva, semen, blood, vaginal mucus, or urine. Common STDs include:  Gonorrhea.  Chlamydia.  Syphilis.  HIV/AIDS.  Genital herpes.  Hepatitis B and C.  Trichomonas.  Human papillomavirus (HPV).  Pubic lice. CAUSES  An STD may be spread by bacteria, virus, or parasite. A person can get an STD by:  Sexual intercourse with an infected person.  Sharing sex toys with an infected person.  Sharing needles with an infected person.  Having intimate contact with the genitals, mouth, or rectal areas of an infected person. SYMPTOMS  Some people may not have any symptoms, but   they can still pass the infection to others. Different STDs have different symptoms. Symptoms include:  Painful or bloody urination.  Pain in the pelvis, abdomen, vagina, anus, throat, or eyes.  Skin rash, itching, irritation, growths, or sores (lesions). These usually occur in the genital or anal area.  Abnormal vaginal discharge.  Penile discharge in men.  Soft, flesh-colored skin growths in the  genital or anal area.  Fever.  Pain or bleeding during sexual intercourse.  Swollen glands in the groin area.  Yellow skin and eyes (jaundice). This is seen with hepatitis. DIAGNOSIS  To make a diagnosis, your caregiver may:  Take a medical history.  Perform a physical exam.  Take a specimen (culture) to be examined.  Examine a sample of discharge under a microscope.  Perform blood test TREATMENT   Chlamydia, gonorrhea, trichomonas, and syphilis can be cured with antibiotic medicine.  Genital herpes, hepatitis, and HIV can be treated, but not cured, with prescribed medicines. The medicines will lessen the symptoms.  Genital warts from HPV can be treated with medicine or by freezing, burning (electrocautery), or surgery. Warts may come back.  HPV is a virus and cannot be cured with medicine or surgery.However, abnormal areas may be followed very closely by your caregiver and may be removed from the cervix, vagina, or vulva through office procedures or surgery. If your diagnosis is confirmed, your recent sexual partners need treatment. This is true even if they are symptom-free or have a negative culture or evaluation. They should not have sex until their caregiver says it is okay. HOME CARE INSTRUCTIONS  All sexual partners should be informed, tested, and treated for all STDs.  Take your antibiotics as directed. Finish them even if you start to feel better.  Only take over-the-counter or prescription medicines for pain, discomfort, or fever as directed by your caregiver.  Rest.  Eat a balanced diet and drink enough fluids to keep your urine clear or pale yellow.  Do not have sex until treatment is completed and you have followed up with your caregiver. STDs should be checked after treatment.  Keep all follow-up appointments, Pap tests, and blood tests as directed by your caregiver.  Only use latex condoms and water-soluble lubricants during sexual activity. Do not use  petroleum jelly or oils.  Avoid alcohol and illegal drugs.  Get vaccinated for HPV and hepatitis. If you have not received these vaccines in the past, talk to your caregiver about whether one or both might be right for you.  Avoid risky sex practices that can break the skin. The only way to avoid getting an STD is to avoid all sexual activity.Latex condoms and dental dams (for oral sex) will help lessen the risk of getting an STD, but will not completely eliminate the risk. SEEK MEDICAL CARE IF:   You have a fever.  You have any new or worsening symptoms. Document Released: 08/15/2002 Document Revised: 08/17/2011 Document Reviewed: 08/22/2010 [facility] Patient Information 2013 [employer].    Domestic Abuse You are being battered or abused if someone close to you hits, pushes, or physically hurts you in any way. You also are being abused if you are forced into activities. You are being sexually abused if you are forced to have sexual contact of any kind. You are being emotionally abused if you are made to feel worthless or if you are constantly threatened. It is important to remember that help is available. No one has the right to abuse you. PREVENTION OF FURTHER  ABUSE  Learn the warning signs of danger. This varies with situations but may include: the use of alcohol, threats, isolation from friends and family, or forced sexual contact. Leave if you feel that violence is going to occur.  If you are attacked or beaten, report it to the police so the abuse is documented. You do not have to press charges. The police can protect you while you or the attackers are leaving. Get the officer's name and badge number and a copy of the report.  Find someone you can trust and tell them what is happening to you: your caregiver, a nurse, clergy member, close friend or family member. Feeling ashamed is natural, but remember that you have done nothing wrong. No one deserves abuse. Document Released:  05/22/2000 Document Revised: 08/17/2011 Document Reviewed: 07/31/2010 ExitCare Patient Information 2013 [employer].    How Much is Too Much Alcohol? Drinking too much alcohol can cause injury, accidents, and health problems. These types of problems can include:   Car crashes.  Falls.  Family fighting (domestic violence).  Drowning.  Fights.  Injuries.  Burns.  Damage to certain organs.  Having a baby with birth defects. ONE DRINK CAN BE TOO MUCH WHEN YOU ARE:  Working.  Pregnant or breastfeeding.  Taking medicines. Ask your doctor.  Driving or planning to drive. If you or someone you know has a drinking problem, get help from a doctor.  Document Released: 03/21/2009 Document Revised: 08/17/2011 Document Reviewed: 03/21/2009 ExitCare Patient Information 2013 [employer].   Smoking Hazards Smoking cigarettes is extremely bad for your health. Tobacco smoke has over 200 known poisons in it. There are over 60 chemicals in tobacco smoke that cause cancer. Some of the chemicals found in cigarette smoke include:   Cyanide.  Benzene.  Formaldehyde.  Methanol (wood alcohol).  Acetylene (fuel used in welding torches).  Ammonia. Cigarette smoke also contains the poisonous gases nitrogen oxide and carbon monoxide.  Cigarette smokers have an increased risk of many serious medical problems and Smoking causes approximately:  90% of all lung cancer deaths in men.  80% of all lung cancer deaths in women.  90% of deaths from chronic obstructive lung disease. Compared with nonsmokers, smoking increases the risk of:  Coronary heart disease by 2 to 4 times.  Stroke by 2 to 4 times.  Men developing lung cancer by 23 times.  Women developing lung cancer by 13 times.  Dying from chronic obstructive lung diseases by 12 times.  . Smoking is the most preventable cause of death and disease in our society.  WHY IS SMOKING ADDICTIVE?  Nicotine is the chemical  agent in tobacco that is capable of causing addiction or dependence.  When you smoke and inhale, nicotine is absorbed rapidly into the bloodstream through your lungs. Nicotine absorbed through the lungs is capable of creating a powerful addiction. Both inhaled and non-inhaled nicotine may be addictive.  Addiction studies of cigarettes and spit tobacco show that addiction to nicotine occurs mainly during the teen years, when young people begin using tobacco products. WHAT ARE THE BENEFITS OF QUITTING?  There are many health benefits to quitting smoking.   Likelihood of developing cancer and heart disease decreases. Health improvements are seen almost immediately.  Blood pressure, pulse rate, and breathing patterns start returning to normal soon after quitting. QUITTING SMOKING   American Lung Association - 1-800-LUNGUSA  American Cancer Society - 1-800-ACS-2345 Document Released: 07/02/2004 Document Revised: 08/17/2011 Document Reviewed: 03/06/2009 ExitCare Patient Information 2013 ExitCare,   LLC.   Stress Management Stress is a state of physical or mental tension that often results from changes in your life or normal routine. Some common causes of stress are:  Death of a loved one.  Injuries or severe illnesses.  Getting fired or changing jobs.  Moving into a new home. Other causes may be:  Sexual problems.  Business or financial losses.  Taking on a large debt.  Regular conflict with someone at home or at work.  Constant tiredness from lack of sleep. It is not just bad things that are stressful. It may be stressful to:  Win the lottery.  Get married.  Buy a new car. The amount of stress that can be easily tolerated varies from person to person. Changes generally cause stress, regardless of the types of change. Too much stress can affect your health. It may lead to physical or emotional problems. Too little stress (boredom) may also become stressful. SUGGESTIONS TO  REDUCE STRESS:  Talk things over with your family and friends. It often is helpful to share your concerns and worries. If you feel your problem is serious, you may want to get help from a professional counselor.  Consider your problems one at a time instead of lumping them all together. Trying to take care of everything at once may seem impossible. List all the things you need to do and then start with the most important one. Set a goal to accomplish 2 or 3 things each day. If you expect to do too many in a single day you will naturally fail, causing you to feel even more stressed.  Do not use alcohol or drugs to relieve stress. Although you may feel better for a short time, they do not remove the problems that caused the stress. They can also be habit forming.  Exercise regularly - at least 3 times per week. Physical exercise can help to relieve that "uptight" feeling and will relax you.  The shortest distance between despair and hope is often a good night's sleep.  Go to bed and get up on time allowing yourself time for appointments without being rushed.  Take a short "time-out" period from any stressful situation that occurs during the day. Close your eyes and take some deep breaths. Starting with the muscles in your face, tense them, hold it for a few seconds, then relax. Repeat this with the muscles in your neck, shoulders, hand, stomach, back and legs.  Take good care of yourself. Eat a balanced diet and get plenty of rest.  Schedule time for having fun. Take a break from your daily routine to relax. HOME CARE INSTRUCTIONS   Call if you feel overwhelmed by your problems and feel you can no longer manage them on your own.  Return immediately if you feel like hurting yourself or someone else. Document Released: 11/18/2000 Document Revised: 08/17/2011 Document Reviewed: 07/11/2007 ExitCare Patient Information 2013 ExitCare[employer].   Oral Contraception Use Oral contraceptive pills  (OCPs) are medicines taken to prevent pregnancy. OCPs work by preventing the ovaries from releasing eggs. The hormones in OCPs also cause the cervical mucus to thicken, preventing the sperm from entering the uterus. The hormones also cause the uterine lining to become thin, not allowing a fertilized egg to attach to the inside of the uterus. OCPs are highly effective when taken exactly as prescribed. However, OCPs do not prevent sexually transmitted diseases (STDs). Safe sex practices, such as using condoms along with an OCP, can help prevent STDs.   Before taking OCPs, you may have a physical exam and Pap test. Your health care provider may also order blood tests if necessary. Your health care provider will make sure you are a good candidate for oral contraception. Discuss with your health care provider the possible side effects of the OCP you may be prescribed. When starting an OCP, it can take 2 to 3 months for the body to adjust to the changes in hormone levels in your body. How to take oral contraceptive pills Your health care provider may advise you on how to start taking the first cycle of OCPs. Otherwise, you can:  Start on day 1 of your menstrual period. You will not need any backup contraceptive protection with this start time.  Start on the first Sunday after your menstrual period or the day you get your prescription. In these cases, you will need to use backup contraceptive protection for the first week.  Start the pill at any time of your cycle. If you take the pill within 5 days of the start of your period, you are protected against pregnancy right away. In this case, you will not need a backup form of birth control. If you start at any other time of your menstrual cycle, you will need to use another form of birth control for 7 days. If your OCP is the type called a minipill, it will protect you from pregnancy after taking it for 2 days (48 hours).  After you have started taking OCPs:  If  you forget to take 1 pill, take it as soon as you remember. Take the next pill at the regular time.  If you miss 2 or more pills, call your health care provider because different pills have different instructions for missed doses. Use backup birth control until your next menstrual period starts.  If you use a 28-day pack that contains inactive pills and you miss 1 of the last 7 pills (pills with no hormones), it will not matter. Throw away the rest of the non-hormone pills and start a new pill pack.  No matter which day you start the OCP, you will always start a new pack on that same day of the week. Have an extra pack of OCPs and a backup contraceptive method available in case you miss some pills or lose your OCP pack. Follow these instructions at home:  Do not smoke.  Always use a condom to protect against STDs. OCPs do not protect against STDs.  Use a calendar to mark your menstrual period days.  Read the information and directions that came with your OCP. Talk to your health care provider if you have questions. Contact a health care provider if:  You develop nausea and vomiting.  You have abnormal vaginal discharge or bleeding.  You develop a rash.  You miss your menstrual period.  You are losing your hair.  You need treatment for mood swings or depression.  You get dizzy when taking the OCP.  You develop acne from taking the OCP.  You become pregnant. Get help right away if:  You develop chest pain.  You develop shortness of breath.  You have an uncontrolled or severe headache.  You develop numbness or slurred speech.  You develop visual problems.  You develop pain, redness, and swelling in the legs. This information is not intended to replace advice given to you by your health care provider. Make sure you discuss any questions you have with your health care provider. Document Released: 05/14/2011   Released: 05/14/2011 Document Revised: 10/31/2015 Document Reviewed:  11/13/2012 Elsevier Interactive Patient Education  2017 [employer].

## 2017-03-24 NOTE — Progress Notes (Signed)
21 y.o. G0P0000 Single  Caucasian Fe here for annual exam. Periods normal with start on Pill pack, but has had brown discharge prior to period and now having red bleeding on day of start with this pack. Has been consistent with time of day and no missed pills. No medication additions or weight gain. Same partner no change or STD screening. Sees Urgent care if needed.  No other health issues today.  Patient's last menstrual period was 03/23/2017.          Sexually active: Yes.    The current method of family planning is OCP (estrogen/progesterone).    Exercising: No.  The patient does not participate in regular exercise at present. Smoker:  yes  Health Maintenance: Pap:  none MMG:  none Self Breast exams: no Colonoscopy:  none BMD:   none TDaP:  2009 Shingles: no  Pneumonia: no Hep C and HIV: not done Labs: discuss with provider   reports that she has never smoked. She has never used smokeless tobacco. She reports that she does not drink alcohol or use drugs.  Past Medical History:  Diagnosis Date  . Anxiety   . Depression     Past Surgical History:  Procedure Laterality Date  . Anxiety    . WISDOM TOOTH EXTRACTION Bilateral 2016    Current Outpatient Prescriptions  Medication Sig Dispense Refill  . JUNEL FE 1/20 1-20 MG-MCG tablet Take 1 tablet by mouth daily. 3 Package 4   No current facility-administered medications for this visit.     Family History  Problem Relation Age of Onset  . Diabetes Maternal Grandmother   . Thyroid disease Paternal Grandmother   . Diabetes Paternal Grandfather   . Stroke Paternal Grandfather     ROS:  Pertinent items are noted in HPI.  Otherwise, a comprehensive ROS was negative.  Exam:   BP (!) 100/58 (BP Location: Right Arm, Patient Position: Sitting, Cuff Size: Normal)   Pulse 60   Resp 12   Ht 5\' 4"  (1.626 m)   Wt 136 lb (61.7 kg)   LMP 03/23/2017   BMI 23.34 kg/m  Height: 5\' 4"  (162.6 cm) Ht Readings from Last 3 Encounters:   03/24/17 5\' 4"  (1.626 m)  02/10/17 5' 3.75" (1.619 m) (41 %, Z= -0.22)*  08/27/16 5' 3.75" (1.619 m) (42 %, Z= -0.21)*   * Growth percentiles are based on CDC 2-20 Years data.    General appearance: alert, cooperative and appears stated age Head: Normocephalic, without obvious abnormality, atraumatic Neck: no adenopathy, supple, symmetrical, trachea midline and thyroid normal to inspection and palpation Lungs: clear to auscultation bilaterally Breasts: normal appearance, no masses or tenderness, No nipple retraction or dimpling, No nipple discharge or bleeding, No axillary or supraclavicular adenopathy Heart: regular rate and rhythm Abdomen: soft, non-tender; no masses,  no organomegaly Extremities: extremities normal, atraumatic, no cyanosis or edema Skin: Skin color, texture, turgor normal. No rashes or lesions Lymph nodes: Cervical, supraclavicular, and axillary nodes normal. No abnormal inguinal nodes palpated Neurologic: Grossly normal   Pelvic: External genitalia:  no lesions              Urethra:  normal appearing urethra with no masses, tenderness or lesions              Bartholin's and Skene's: normal                 Vagina: normal appearing vagina with normal color and discharge, no lesions  Cervix: no cervical motion tenderness, no lesions and nulliparous appearance              Pap taken: No. Bimanual Exam:  Uterus:  normal size, contour, position, consistency, mobility, non-tender and anteverted              Adnexa: normal adnexa and no mass, fullness, tenderness               Rectovaginal: Confirms               Anus:  normal appearance  Chaperone present: yes  A:  Well Woman with normal exam  Contraception  OCP with consistent BTB, needs better endometrial support  P:   Reviewed health and wellness pertinent to exam  Discussed etiology of BTB and need to change dosage to see if this resolves the spotting. Patient agreeable. Patient will keep menses  calendar and advise at 3 months if this has stopped. Discussed adjustment period with new dose.  Rx Loestrin 1.5/30 Fe see order with instructions  Pap smear: no   counseled on breast self exam, STD prevention, HIV risk factors and prevention, use and side effects of OCP's, adequate intake of calcium and vitamin D, diet and exercise  return annually or prn  An After Visit Summary was printed and given to the patient.

## 2017-04-19 ENCOUNTER — Other Ambulatory Visit: Payer: Self-pay | Admitting: Certified Nurse Midwife

## 2017-04-19 DIAGNOSIS — Z3041 Encounter for surveillance of contraceptive pills: Secondary | ICD-10-CM

## 2017-05-13 ENCOUNTER — Ambulatory Visit: Payer: BLUE CROSS/BLUE SHIELD | Admitting: Certified Nurse Midwife

## 2017-10-02 ENCOUNTER — Other Ambulatory Visit: Payer: Self-pay

## 2017-10-02 ENCOUNTER — Ambulatory Visit (INDEPENDENT_AMBULATORY_CARE_PROVIDER_SITE_OTHER): Payer: BLUE CROSS/BLUE SHIELD | Admitting: Family Medicine

## 2017-10-02 ENCOUNTER — Encounter: Payer: Self-pay | Admitting: Family Medicine

## 2017-10-02 VITALS — BP 110/70 | HR 113 | Temp 98.2°F | Resp 16 | Ht 64.0 in | Wt 141.4 lb

## 2017-10-02 DIAGNOSIS — L729 Follicular cyst of the skin and subcutaneous tissue, unspecified: Secondary | ICD-10-CM | POA: Diagnosis not present

## 2017-10-02 NOTE — Progress Notes (Signed)
Chief Complaint  Patient presents with  . Cyst    HPI  Patient reports that she has a lesion in the R arm pit She states that it feels like an ingrown hair She states that it also happens 4 years ago She states that this morning she had soreness with some pus from the area She states that she squeezed it and more pus was expressed She states that she noted that something was coming from the opening that looked like a straw. The first time this happened she was seen by Dermatology 4 years ago She states that she was given the option for surgical removal or watching and waiting She notices that every year around the time that the weather gets hot she has discomfort in the right axillae    Past Medical History:  Diagnosis Date  . Anxiety   . Depression     Current Outpatient Medications  Medication Sig Dispense Refill  . norethindrone-ethinyl estradiol (JUNEL FE,GILDESS FE,LOESTRIN FE) 1-20 MG-MCG tablet Take 1 tablet by mouth daily.     No current facility-administered medications for this visit.     Allergies: No Known Allergies  Past Surgical History:  Procedure Laterality Date  . Anxiety    . WISDOM TOOTH EXTRACTION Bilateral 2016    Social History   Socioeconomic History  . Marital status: Single    Spouse name: Not on file  . Number of children: Not on file  . Years of education: Not on file  . Highest education level: Not on file  Occupational History  . Not on file  Social Needs  . Financial resource strain: Not on file  . Food insecurity:    Worry: Not on file    Inability: Not on file  . Transportation needs:    Medical: Not on file    Non-medical: Not on file  Tobacco Use  . Smoking status: Never Smoker  . Smokeless tobacco: Never Used  Substance and Sexual Activity  . Alcohol use: No    Alcohol/week: 0.0 oz  . Drug use: No  . Sexual activity: Yes    Partners: Male    Birth control/protection: Pill  Lifestyle  . Physical activity:    Days  per week: Not on file    Minutes per session: Not on file  . Stress: Not on file  Relationships  . Social connections:    Talks on phone: Not on file    Gets together: Not on file    Attends religious service: Not on file    Active member of club or organization: Not on file    Attends meetings of clubs or organizations: Not on file    Relationship status: Not on file  Other Topics Concern  . Not on file  Social History Narrative  . Not on file    Family History  Problem Relation Age of Onset  . Diabetes Maternal Grandmother   . Thyroid disease Paternal Grandmother   . Diabetes Paternal Grandfather   . Stroke Paternal Grandfather      ROS Review of Systems See HPI Constitution: No fevers or chills No malaise No diaphoresis Skin: No rash or itching Eyes: no blurry vision, no double vision GU: no dysuria or hematuria Neuro: no dizziness or headaches all others reviewed and negative   Objective: Vitals:   10/02/17 1359  BP: 110/70  Pulse: (!) 113  Resp: 16  Temp: 98.2 F (36.8 C)  SpO2: 99%  Weight: 141 lb 6.4 oz (64.1  kg)  Height: 5\' 4"  (1.626 m)    Physical Exam  Constitutional: She is oriented to person, place, and time. She appears well-developed and well-nourished.  HENT:  Head: Normocephalic and atraumatic.  Eyes: Conjunctivae and EOM are normal.  Neck: Normal range of motion. Neck supple. No thyromegaly present.  Cardiovascular: Normal rate, regular rhythm and normal heart sounds.  No murmur heard. Pulmonary/Chest: Effort normal and breath sounds normal. No stridor. No respiratory distress.  Neurological: She is alert and oriented to person, place, and time.  Skin: Skin is warm. Capillary refill takes less than 2 seconds.  Psychiatric: She has a normal mood and affect. Her behavior is normal. Judgment and thought content normal.    Visible stalk in the left axillae With traction the stalk separated easily from the skin Palpation of the axillae  did not express any exudate No fluctuance No induration   Assessment and Plan Daylynn was seen today for cyst.  Diagnoses and all orders for this visit:  Cyst of skin  -  Discussed skin care Advised to change Deoderant to avoid antiperspirants    Reade Trefz A Nolon Rod

## 2017-10-02 NOTE — Patient Instructions (Addendum)
  Avoid antiperspirant deodorants Keep skin dry Follow up with Dermatology   IF you received an x-ray today, you will receive an invoice from [facility]. Please contact [facility] at [phone] with questions or concerns regarding your invoice.   IF you received labwork today, you will receive an invoice from [facility]. Please contact [facility] at [phone] with questions or concerns regarding your invoice.   Our billing staff will not be able to assist you with questions regarding bills from these companies.  You will be contacted with the lab results as soon as they are available. The fastest way to get your results is to activate your My Chart account. Instructions are located on the last page of this paperwork. If you have not heard from [place] regarding the results in 2 weeks, please contact this office.

## 2017-12-30 ENCOUNTER — Ambulatory Visit: Payer: Self-pay | Admitting: *Deleted

## 2017-12-30 ENCOUNTER — Encounter: Payer: Self-pay | Admitting: Family Medicine

## 2017-12-30 ENCOUNTER — Ambulatory Visit (INDEPENDENT_AMBULATORY_CARE_PROVIDER_SITE_OTHER): Payer: BLUE CROSS/BLUE SHIELD | Admitting: Family Medicine

## 2017-12-30 ENCOUNTER — Other Ambulatory Visit: Payer: Self-pay

## 2017-12-30 VITALS — BP 106/70 | HR 88 | Temp 99.4°F | Resp 17 | Ht 64.0 in | Wt 140.2 lb

## 2017-12-30 DIAGNOSIS — R519 Headache, unspecified: Secondary | ICD-10-CM

## 2017-12-30 DIAGNOSIS — R42 Dizziness and giddiness: Secondary | ICD-10-CM

## 2017-12-30 DIAGNOSIS — R51 Headache: Secondary | ICD-10-CM

## 2017-12-30 LAB — CBC
HCT: 44.3 % (ref 35.0–45.0)
HEMOGLOBIN: 15.2 g/dL (ref 11.7–15.5)
MCH: 33.2 pg — AB (ref 27.0–33.0)
MCHC: 34.3 g/dL (ref 32.0–36.0)
MCV: 96.7 fL (ref 80.0–100.0)
MPV: 9.5 fL (ref 7.5–12.5)
Platelets: 243 10*3/uL (ref 140–400)
RBC: 4.58 10*6/uL (ref 3.80–5.10)
RDW: 12.2 % (ref 11.0–15.0)
WBC: 4.6 10*3/uL (ref 3.8–10.8)

## 2017-12-30 LAB — BASIC METABOLIC PANEL
BUN: 12 mg/dL (ref 7–25)
CO2: 26 mmol/L (ref 20–32)
Calcium: 9.5 mg/dL (ref 8.6–10.2)
Chloride: 105 mmol/L (ref 98–110)
Creat: 0.64 mg/dL (ref 0.50–1.10)
GLUCOSE: 97 mg/dL (ref 65–99)
POTASSIUM: 3.8 mmol/L (ref 3.5–5.3)
SODIUM: 138 mmol/L (ref 135–146)

## 2017-12-30 NOTE — Patient Instructions (Addendum)
IF you received an x-ray today, you will receive an invoice from [facility]. Please contact [facility] at [phone] with questions or concerns regarding your invoice.   IF you received labwork today, you will receive an invoice from [facility]. Please contact [facility] at [phone] with questions or concerns regarding your invoice.   Our billing staff will not be able to assist you with questions regarding bills from these companies.  You will be contacted with the lab results as soon as they are available. The fastest way to get your results is to activate your My Chart account. Instructions are located on the last page of this paperwork. If you have not heard from [place] regarding the results in 2 weeks, please contact this office.    Dizziness Dizziness is a common problem. It is a feeling of unsteadiness or light-headedness. You may feel like you are about to faint. Dizziness can lead to injury if you stumble or fall. Anyone can become dizzy, but dizziness is more common in older adults. This condition can be caused by a number of things, including medicines, dehydration, or illness. Follow these instructions at home: Eating and drinking  Drink enough fluid to keep your urine clear or pale yellow. This helps to keep you from becoming dehydrated. Try to drink more clear fluids, such as water.  Do not drink alcohol.  Limit your caffeine intake if told to do so by your health care provider. Check ingredients and nutrition facts to see if a food or beverage contains caffeine.  Limit your salt (sodium) intake if told to do so by your health care provider. Check ingredients and nutrition facts to see if a food or beverage contains sodium. Activity  Avoid making quick movements. ? Rise slowly from chairs and steady yourself until you feel okay. ? In the morning, first sit up on the side of the bed. When you feel okay, stand slowly while you hold onto something until  you know that your balance is fine.  If you need to stand in one place for a long time, move your legs often. Tighten and relax the muscles in your legs while you are standing.  Do not drive or use heavy machinery if you feel dizzy.  Avoid bending down if you feel dizzy. Place items in your home so that they are easy for you to reach without leaning over. Lifestyle  Do not use any products that contain nicotine or tobacco, such as cigarettes and e-cigarettes. If you need help quitting, ask your health care provider.  Try to reduce your stress level by using methods such as yoga or meditation. Talk with your health care provider if you need help to manage your stress. General instructions  Watch your dizziness for any changes.  Take over-the-counter and prescription medicines only as told by your health care provider. Talk with your health care provider if you think that your dizziness is caused by a medicine that you are taking.  Tell a friend or a family member that you are feeling dizzy. If he or she notices any changes in your behavior, have this person call your health care provider.  Keep all follow-up visits as told by your health care provider. This is important. Contact a health care provider if:  Your dizziness does not go away.  Your dizziness or light-headedness gets worse.  You feel nauseous.  You have reduced hearing.  You have new symptoms.  You are unsteady on your feet or  you feel like the room is spinning. Get help right away if:  You vomit or have diarrhea and are unable to eat or drink anything.  You have problems talking, walking, swallowing, or using your arms, hands, or legs.  You feel generally weak.  You are not thinking clearly or you have trouble forming sentences. It may take a friend or family member to notice this.  You have chest pain, abdominal pain, shortness of breath, or sweating.  Your vision changes.  You have any bleeding.  You  have a severe headache.  You have neck pain or a stiff neck.  You have a fever. These symptoms may represent a serious problem that is an emergency. Do not wait to see if the symptoms will go away. Get medical help right away. Call your local emergency services (911 in the [place] Do not drive yourself to the hospital. Summary  Dizziness is a feeling of unsteadiness or light-headedness. This condition can be caused by a number of things, including medicines, dehydration, or illness.  Anyone can become dizzy, but dizziness is more common in older adults.  Drink enough fluid to keep your urine clear or pale yellow. Do not drink alcohol.  Avoid making quick movements if you feel dizzy. Monitor your dizziness for any changes. This information is not intended to replace advice given to you by your health care provider. Make sure you discuss any questions you have with your health care provider. Document Released: 11/18/2000 Document Revised: 06/27/2016 Document Reviewed: 06/27/2016 Elsevier Interactive Patient Education  [employer].

## 2017-12-30 NOTE — Telephone Encounter (Signed)
Pt passed out at work, she thinks for a couple of mins. Did not hit anything. Was starting to feel like she was having a panic attack before passing out. And had headache. Left work, feels now. States stress will make her pas out.  appointment scheduled per pt request. Will route to [facility].   Reason for Disposition . [1] All other patients AND [2] now alert and feels fine(Exception: SIMPLE FAINT due to stress, pain, prolonged standing, or suddenly standing)  Answer Assessment - Initial Assessment Questions 1. ONSET: "How long were you unconscious?" (minutes) "When did it happen?"     Just a couple of mins, and happened this morning 2. CONTENT: "What happened during period of unconsciousness?" (e.g., seizure activity)      n/a 3. MENTAL STATUS: "Alert and oriented now?" (oriented x 3 = name, month, location)      yes 4. TRIGGER: "What do you think caused the fainting?" "What were you doing just before you fainted?"  (e.g., exercise, sudden standing up, prolonged standing)     Not sure 5. RECURRENT SYMPTOM: "Have you ever passed out before?" If so, ask: "When was the last time?" and "What happened that time?"     Couple a times from stress 6. INJURY: "Did you sustain any injury during the fall?"      no 7. CARDIAC SYMPTOMS: "Have you had any of the following symptoms: chest pain, difficulty breathing, palpitations?"     no 8. NEUROLOGIC SYMPTOMS: "Have you had any of the following symptoms: headache, numbness, vertigo, weakness?"     Panic attack, chest tightness, headache since arising from bed  9. GI SYMPTOMS: "Have you had any of the following symptoms: abdominal pain, vomiting, diarrhea, blood in stools?"     no 10. OTHER SYMPTOMS: "Do you have any other symptoms?"       no 11. PREGNANCY: "Is there any chance you are pregnant?" "When was your last menstrual period?"      NO . Having period now  Protocols used: [facility]

## 2017-12-30 NOTE — Progress Notes (Signed)
Chief Complaint  Patient presents with  . passed out at work    today approx. 10 am per pt.  Per pt she went to br because she felt like she was having a panic attack(no attacks since high school) with dizziness and chest tightness, she woke up to someone knocking on br door.    HPI  She reports that at 10 a.m. she felt dizzy and had a headache She was sitting on the toilet with the lid closed She states that somebody knocked on the door and she woke back up She states that she was not unconscious but "was definitely out of it for a second" She stats that she just woke up and had not had anything to eat or drink as  Yet.  She denies sweats, racing of the heart, palpitations She states that her dizziness resolved but her head still hurts She stats that she typically gets headaches that is typically migrainous and localized to one side of her head. This headache is bitemporal. She denies vision changes or speech changes. She has been dieting to lose weight and has been eating less but does not remember what she ate last night  Past Medical History:  Diagnosis Date  . Anxiety   . Depression     Current Outpatient Medications  Medication Sig Dispense Refill  . Norethin Ace-Eth Estrad-FE (BLISOVI FE 1.5/30 PO) Take by mouth.     No current facility-administered medications for this visit.     Allergies: No Known Allergies  Past Surgical History:  Procedure Laterality Date  . Anxiety    . WISDOM TOOTH EXTRACTION Bilateral 2016    Social History   Socioeconomic History  . Marital status: Single    Spouse name: Not on file  . Number of children: Not on file  . Years of education: Not on file  . Highest education level: Not on file  Occupational History  . Not on file  Social Needs  . Financial resource strain: Not on file  . Food insecurity:    Worry: Not on file    Inability: Not on file  . Transportation needs:    Medical: Not on file    Non-medical: Not on file    Tobacco Use  . Smoking status: Never Smoker  . Smokeless tobacco: Never Used  Substance and Sexual Activity  . Alcohol use: No    Alcohol/week: 0.0 oz  . Drug use: No  . Sexual activity: Yes    Partners: Male    Birth control/protection: Pill  Lifestyle  . Physical activity:    Days per week: Not on file    Minutes per session: Not on file  . Stress: Not on file  Relationships  . Social connections:    Talks on phone: Not on file    Gets together: Not on file    Attends religious service: Not on file    Active member of club or organization: Not on file    Attends meetings of clubs or organizations: Not on file    Relationship status: Not on file  Other Topics Concern  . Not on file  Social History Narrative  . Not on file    Family History  Problem Relation Age of Onset  . Diabetes Maternal Grandmother   . Thyroid disease Paternal Grandmother   . Diabetes Paternal Grandfather   . Stroke Paternal Grandfather      ROS Review of Systems See HPI Constitution: No fevers or chills No malaise  No diaphoresis Skin: No rash or itching Eyes: no blurry vision, no double vision GU: no dysuria or hematuria Neuro: no dizziness or headaches all others reviewed and negative   Objective: Vitals:   12/30/17 1154  BP: 106/70  Pulse: 88  Resp: 17  Temp: 99.4 F (37.4 C)  TempSrc: Oral  SpO2: 95%  Weight: 140 lb 3.2 oz (63.6 kg)  Height: 5\' 4"  (1.626 m)    Physical Exam  Constitutional: She is oriented to person, place, and time. She appears well-developed and well-nourished.  HENT:  Head: Normocephalic and atraumatic.  Right Ear: External ear normal.  Left Ear: External ear normal.  Nose: Nose normal.  Mouth/Throat: Oropharynx is clear and moist.  Eyes: Conjunctivae and EOM are normal.  Neck: Normal range of motion. Neck supple.  Cardiovascular: Normal rate, regular rhythm and normal heart sounds.  No murmur heard. Pulmonary/Chest: Effort normal and breath  sounds normal. No stridor. No respiratory distress. She has no wheezes.  Abdominal: Soft. Bowel sounds are normal. She exhibits no distension and no mass. There is no tenderness. There is no guarding.  Musculoskeletal: Normal range of motion. She exhibits no edema.  Neurological: She is alert and oriented to person, place, and time. She has normal strength and normal reflexes. She displays no atrophy and no tremor. No cranial nerve deficit or sensory deficit. She exhibits normal muscle tone. She displays no seizure activity.  Skin: Skin is warm. Capillary refill takes less than 2 seconds.  Psychiatric: She has a normal mood and affect. Her behavior is normal. Judgment and thought content normal.      Assessment and Plan Sammie was seen today for passed out at work.  Diagnoses and all orders for this visit:  Dizziness Frontal Headache Symptoms currently resolved No orthostatic hypotension, pt well hydrated Unclear the etiology No risk factors for cardiac pathology Vasovagal vs. Hypoglycemia  Gave work note  -     Basic metabolic panel -     CBC  A total of 30 minutes were spent face-to-face with the patient during this encounter and over half of that time was spent on counseling and coordination of care.    [place]

## 2017-12-30 NOTE — Telephone Encounter (Signed)
Pt called with having a lump that is located under the right arm pit. She has had a similiar one before were drainage was extracted from it. She has an appointment this morning and will talk with her pcp regarding this lump.  No fever. Sometimes she will get a little drainage from it but not a lot. Only tender when she uses her arm.  Will route to flow at [facility]  Reason for Disposition . [1] Swelling is painful to touch AND [2] no fever  Answer Assessment - Initial Assessment Questions 1. APPEARANCE of SWELLING: "What does it look like?" (e.g., lymph node, insect bite, mole)     lump 2. SIZE: "How large is the swelling?" (inches, cm or compare to coins)     About the size of a quarter 3. LOCATION: "Where is the swelling located?"     Under right arm 4. ONSET: "When did the swelling start?"     Last couple of days 5. PAIN: "Is it painful?" If so, ask: "How much?"     Not really painful but can tell feel some tenderness when she uses her arm 6. ITCH: "Does it itch?" If so, ask: "How much?"     no 7. CAUSE: "What do you think caused the swelling?"     Not sure 8. OTHER SYMPTOMS: "Do you have any other symptoms?" (e.g., fever)     no  Protocols used: SKIN LUMP OR LOCALIZED SWELLING-A-AH

## 2018-02-26 ENCOUNTER — Other Ambulatory Visit: Payer: Self-pay | Admitting: Certified Nurse Midwife

## 2018-02-26 DIAGNOSIS — Z30011 Encounter for initial prescription of contraceptive pills: Secondary | ICD-10-CM

## 2018-02-28 NOTE — Telephone Encounter (Signed)
Patient called to check on the status of her refill request.

## 2018-02-28 NOTE — Telephone Encounter (Signed)
Dr. Talbert Nan,  Would you mind co-signing this Rx request for Sonya Edwards CNM.  Pt needs refill of OCP.  Has annual exam 04/06/18 and out of pills.  Sonya Edwards CNM not in office today.

## 2018-02-28 NOTE — Telephone Encounter (Signed)
Patient calling to check on status of her refill request. Patient is completely out of medication and is concerned about missing.

## 2018-02-28 NOTE — Telephone Encounter (Signed)
Medication refill request: Blisovi fe Last AEX:  03/24/17 Next AEX: 04/06/18 Last MMG (if hormonal medication request): na Refill authorized: Please refill until AEX if appropriate.

## 2018-02-28 NOTE — Telephone Encounter (Signed)
Advised patient rx sent.  Encounter closed.

## 2018-03-22 ENCOUNTER — Other Ambulatory Visit: Payer: Self-pay | Admitting: Obstetrics and Gynecology

## 2018-03-22 DIAGNOSIS — Z30011 Encounter for initial prescription of contraceptive pills: Secondary | ICD-10-CM

## 2018-03-24 ENCOUNTER — Other Ambulatory Visit: Payer: Self-pay | Admitting: Obstetrics and Gynecology

## 2018-03-24 DIAGNOSIS — Z30011 Encounter for initial prescription of contraceptive pills: Secondary | ICD-10-CM

## 2018-03-24 NOTE — Telephone Encounter (Signed)
Medication refill request: Blisove FE Last AEX:  02/13/16 Next AEX: 04/06/18 Last MMG (if hormonal medication request): NA Refill authorized: #[id number] with 0 RF please refill until AEX

## 2018-03-24 NOTE — Telephone Encounter (Signed)
Patient callled to check on the status of the refill request. She is due to start a new pack on Sunday.

## 2018-03-24 NOTE — Telephone Encounter (Signed)
Patient would like refill on birth control. States it was denied. Annual exam scheduled for 04/06/18.

## 2018-03-24 NOTE — Telephone Encounter (Signed)
Patient is scheduled for annual 04/06/2018.

## 2018-03-25 ENCOUNTER — Telehealth: Payer: Self-pay | Admitting: Obstetrics and Gynecology

## 2018-03-25 DIAGNOSIS — Z30011 Encounter for initial prescription of contraceptive pills: Secondary | ICD-10-CM

## 2018-03-25 NOTE — Telephone Encounter (Signed)
Patient has been notified that RX was sent to Pharmacy on file.

## 2018-03-25 NOTE — Telephone Encounter (Signed)
Patient called to check on the status of the refill but disconnected the call stating she had a call coming in. When I tried to return her call her number was busy.

## 2018-03-25 NOTE — Telephone Encounter (Signed)
Medication refill request:  Blisovi FE  Last AEX:  02/13/16 Next AEX: 04/06/18 Last MMG (if hormonal medication request): NA Refill authorized: #[id number] with 0 RF

## 2018-03-25 NOTE — Telephone Encounter (Signed)
Patient called stating she really needs the refill of her birth control until her aex 04/06/18. Patient states she has been trying to get this refilled all week. She said " I see Sonya Edwards and the pharmacy sent the request to Dr.Jertson, could this be the reason for the delay?". She asked if someone could please call her today with the status?[name]

## 2018-04-05 ENCOUNTER — Ambulatory Visit: Payer: BLUE CROSS/BLUE SHIELD | Admitting: Certified Nurse Midwife

## 2018-04-06 ENCOUNTER — Encounter: Payer: Self-pay | Admitting: Certified Nurse Midwife

## 2018-04-06 ENCOUNTER — Other Ambulatory Visit (HOSPITAL_COMMUNITY)
Admission: RE | Admit: 2018-04-06 | Discharge: 2018-04-06 | Disposition: A | Discharge status: Home / Self Care | Payer: BLUE CROSS/BLUE SHIELD | Source: Ambulatory Visit | Attending: Obstetrics & Gynecology | Admitting: Obstetrics & Gynecology

## 2018-04-06 ENCOUNTER — Ambulatory Visit (INDEPENDENT_AMBULATORY_CARE_PROVIDER_SITE_OTHER): Payer: BLUE CROSS/BLUE SHIELD | Admitting: Certified Nurse Midwife

## 2018-04-06 ENCOUNTER — Other Ambulatory Visit: Payer: Self-pay

## 2018-04-06 VITALS — BP 116/64 | HR 68 | Resp 16 | Ht 63.25 in | Wt 146.0 lb

## 2018-04-06 DIAGNOSIS — Z01411 Encounter for gynecological examination (general) (routine) with abnormal findings: Secondary | ICD-10-CM | POA: Diagnosis not present

## 2018-04-06 DIAGNOSIS — Z3041 Encounter for surveillance of contraceptive pills: Secondary | ICD-10-CM | POA: Diagnosis not present

## 2018-04-06 DIAGNOSIS — R2231 Localized swelling, mass and lump, right upper limb: Secondary | ICD-10-CM

## 2018-04-06 DIAGNOSIS — Z124 Encounter for screening for malignant neoplasm of cervix: Secondary | ICD-10-CM

## 2018-04-06 MED ORDER — NORETHIN ACE-ETH ESTRAD-FE 1.5-30 MG-MCG PO TABS: 1.0000 | ORAL_TABLET | Freq: Every day | ORAL | 12 refills | Status: DC

## 2018-04-06 NOTE — Progress Notes (Signed)
22 y.o. G0P0000 Single  Caucasian Fe here for annual exam. Periods normal, no issues with missed pills or missed periods. No partner change,  No STD screening concerns. Saw [place] for lump under right arm with soreness off and on. Does not flare with shaving or deodorant use. Please check. She has labs to evaluate and all normal. Was told lymph and sweat gland change. Has noted sebaceous in area and had previous removed with sac expelled. No other health issues today.  Patient's last menstrual period was 03/22/2018 (exact date).          Sexually active: Yes.    The current method of family planning is OCP (estrogen/progesterone).    Exercising: No.  exercise Smoker:  no  Review of Systems  Constitutional: Negative.   HENT: Negative.   Eyes: Negative.   Respiratory: Negative.   Cardiovascular: Negative.   Gastrointestinal: Negative.   Genitourinary: Negative.   Musculoskeletal: Negative.   Skin: Negative.   Neurological: Negative.   Endo/Heme/Allergies: Negative.   Psychiatric/Behavioral: Negative.     Health Maintenance: Pap:  none History of Abnormal Pap: no MMG:  none Self Breast exams: yes Colonoscopy:  none BMD:   none TDaP:  2009 Shingles: no Pneumonia: no Hep C and HIV: not done Labs: no   reports that she has never smoked. She has never used smokeless tobacco. She reports that she drinks about 1.0 - 2.0 standard drinks of alcohol per week. She reports that she does not use drugs.  Past Medical History:  Diagnosis Date  . Anxiety   . Depression     Past Surgical History:  Procedure Laterality Date  . Anxiety    . WISDOM TOOTH EXTRACTION Bilateral 2016    Current Outpatient Medications  Medication Sig Dispense Refill  . BLISOVI FE 1.5/30 1.5-30 MG-MCG tablet TAKE 1 TABLET BY MOUTH DAILY 28 tablet 0   No current facility-administered medications for this visit.     Family History  Problem Relation Age of Onset  . Diabetes Maternal Grandmother    . Thyroid disease Paternal Grandmother   . Diabetes Paternal Grandfather   . Stroke Paternal Grandfather     ROS:  Pertinent items are noted in HPI.  Otherwise, a comprehensive ROS was negative.  Exam:   BP 116/64   Pulse 68   Resp 16   Ht 5' 3.25" (1.607 m)   Wt 146 lb (66.2 kg)   LMP 03/22/2018 (Exact Date)   BMI 25.66 kg/m  Height: 5' 3.25" (160.7 cm) Ht Readings from Last 3 Encounters:  04/06/18 5' 3.25" (1.607 m)  12/30/17 5\' 4"  (1.626 m)  10/02/17 5\' 4"  (1.626 m)    General appearance: alert, cooperative and appears stated age Head: Normocephalic, without obvious abnormality, atraumatic Neck: no adenopathy, supple, symmetrical, trachea midline and thyroid normal to inspection and palpation Lungs: clear to auscultation bilaterally Breasts: normal appearance, no masses or tenderness, No nipple retraction or dimpling, No nipple discharge or bleeding, No axillary or supraclavicular adenopathy, enlarged axillary ?lymph node ?cyst felt in center of right underarm, slightly tender, no redness Heart: regular rate and rhythm Abdomen: soft, non-tender; no masses,  no organomegaly Extremities: extremities normal, atraumatic, no cyanosis or edema Skin: Skin color, texture, turgor normal. No rashes or lesions Lymph nodes: Cervical, supraclavicular, and axillary nodes normal. No abnormal inguinal nodes palpated Neurologic: Grossly normal   Pelvic: External genitalia:  no lesions              Urethra:  normal appearing urethra with no masses, tenderness or lesions              Bartholin's and Skene's: normal                 Vagina: normal appearing vagina with normal color and discharge, no lesions              Cervix: no cervical motion tenderness, no lesions and nulliparous appearance              Pap taken: Yes.   Bimanual Exam:  Uterus:  normal size, contour, position, consistency, mobility, non-tender and anteverted              Adnexa: normal adnexa and no mass, fullness,  tenderness               Rectovaginal: Confirms               Anus:  normal appearance, no lesions  Chaperone present: yes  A:  Well Woman with normal exam  Contraception OCP working well  Enlarged right axillary area ? Cyst vs Lymph node, no infection noted   P:   Reviewed health and wellness pertinent to exam  Reviewed risks/benefits/warning signs of OCP, desires continuance  Rx Blisovi Fe 1.5/30 see order with instructions Discussed finding in right axilla and due to being there for  Prolonged period of time feel [place] warranted. Patient agreeable and will be scheduled prior to leaving today. Questions addressed.  Pap smear: yes   counseled on breast self exam, STD prevention, HIV risk factors and prevention, use and side effects of OCP's, adequate intake of calcium and vitamin D, diet and exercise  return annually or prn  An After Visit Summary was printed and given to the patient.

## 2018-04-06 NOTE — Progress Notes (Signed)
Patient scheduled while in office for right breast US, to include axilla, on 04/22/18 at 12:30pm at [facility]. Patient declined earlier appointments due to her work schedule. Patient verbalizes understanding and is agreeable.

## 2018-04-08 LAB — CYTOLOGY - PAP: DIAGNOSIS: NEGATIVE

## 2018-04-09 ENCOUNTER — Encounter: Payer: Self-pay | Admitting: Family Medicine

## 2018-04-22 ENCOUNTER — Other Ambulatory Visit: Payer: Self-pay | Admitting: Certified Nurse Midwife

## 2018-04-22 ENCOUNTER — Ambulatory Visit
Admission: RE | Admit: 2018-04-22 | Discharge: 2018-04-22 | Disposition: A | Discharge status: Home / Self Care | Payer: BLUE CROSS/BLUE SHIELD | Source: Ambulatory Visit | Attending: Certified Nurse Midwife | Admitting: Certified Nurse Midwife

## 2018-04-22 DIAGNOSIS — R2231 Localized swelling, mass and lump, right upper limb: Secondary | ICD-10-CM

## 2018-04-28 ENCOUNTER — Telehealth: Payer: Self-pay | Admitting: Emergency Medicine

## 2018-04-28 NOTE — Telephone Encounter (Signed)
-----   Message from Megan Salon, MD sent at 04/27/2018  8:26 PM EST ----- Regarding: RE: MMG Hold  Yes.  Ok to remove from [facility] hold.  Thanks.  MSM ----- Message ----- From: Michele Mcalpine, RN Sent: 04/27/2018   2:57 PM EST To: Megan Salon, MD Subject: MMG Hold                                       Melvia Heaps CNM patient. R Axillary ultrasound on 04/22/18.  Completed and patient aware of results.  Okay to remove from hold?

## 2019-01-03 ENCOUNTER — Telehealth: Payer: Self-pay | Admitting: Certified Nurse Midwife

## 2019-01-03 ENCOUNTER — Other Ambulatory Visit: Payer: Self-pay

## 2019-01-03 ENCOUNTER — Encounter: Payer: Self-pay | Admitting: Certified Nurse Midwife

## 2019-01-03 ENCOUNTER — Ambulatory Visit: Payer: BC Managed Care – PPO | Admitting: Certified Nurse Midwife

## 2019-01-03 VITALS — BP 104/64 | HR 70 | Temp 97.3°F | Resp 16 | Wt 151.0 lb

## 2019-01-03 DIAGNOSIS — N898 Other specified noninflammatory disorders of vagina: Secondary | ICD-10-CM

## 2019-01-03 DIAGNOSIS — B373 Candidiasis of vulva and vagina: Secondary | ICD-10-CM

## 2019-01-03 DIAGNOSIS — B3731 Acute candidiasis of vulva and vagina: Secondary | ICD-10-CM

## 2019-01-03 MED ORDER — NYSTATIN 100000 UNIT/GM EX CREA: 1.0000 "application " | TOPICAL_CREAM | Freq: Two times a day (BID) | CUTANEOUS | 1 refills | Status: DC

## 2019-01-03 NOTE — Telephone Encounter (Signed)
Spoke with patient. Reports small "cuts" on labia for the past 2-3 days. Have been present in the past and evaluated. Itchy at times. Uncomfortable and unable to have intercourse due to discomfort. White, milky vaginal d/c, no odor. Advised OV needed for further evaluation, Covid19 prescreen negative, precautions reviewed. OV scheduled for today at 11:30am with Melvia Heaps, CNM. Patient states she needs to confirm appt with employer, will return call to office if she needs to change appt time. Offered 4pm 7/28 or 7/29 at 11:30am.   Routing to provider for final review. Patient is agreeable to disposition. Will close encounter.

## 2019-01-03 NOTE — Telephone Encounter (Signed)
Patient sent the following correspondence through [place]. Routing to triage to assist patient with request.  Hi Dr. Hollice Espy, I have my annual coming up in November but seeing an issue I've had several times in the past. I have the small cuts on my labia majora again. They are a little itchy from time to time. I haven't seen this in several months but they have come back in the last few days. Very uncomfortable at times, and definitely unable to have sex. I don't mind coming into the office if needed, but was hoping to find a solution without coming in since this is an ongoing issue for me. Hope to hear from you soon!  To my previous message, I also have a small amount of a milky white discharge coming from my vagina.

## 2019-01-03 NOTE — Progress Notes (Signed)
23 y.o. Single Caucasian female G0P0000 here with complaint of vaginal symptoms of  increase discharge, has noted cracking of skin again and redness around small cut like areas of vulva that she has had before. Describes discharge as white, no odor. No partner change or STD screening. Engaged and planning marriage at some point. Onset of symptoms 4 days ago. Denies new personal products or dryness.  Urinary symptoms none. Contraception OCP, working well.  Review of Systems  Constitutional: Negative.   HENT: Negative.   Eyes: Negative.   Respiratory: Negative.   Cardiovascular: Negative.   Gastrointestinal: Negative.   Genitourinary:       Vaginal cuts & discharge  Musculoskeletal: Negative.   Skin: Negative.   Neurological: Negative.   Endo/Heme/Allergies: Negative.   Psychiatric/Behavioral: Negative.     O:Healthy female WDWN Affect: normal, orientation x 3  Exam: Skin warm and dry Abdomen: soft, non tender Inguinal Lymph nodes: no enlargement or tenderness Pelvic exam: External genital: normal female, no lesions or blisters noted BUS: negative Vulva: small cracking and with slit like appearance present bilateral on vulva and noted at perineal area below fourchette, slightly tender, no blisters noted Vagina:  White thick non odorous discharge noted.   Affirm taken Cervix: normal, non tender, no CMT Uterus: normal, non tender Adnexa:normal, non tender, no masses or fullness noted   A:Normal pelvic exam History of yeast vaginitis and vulvitis, appearance same as before   P:Discussed findings of yeast appearing vulvitis again as treated before and etiology. Discussed Aveeno or baking soda sitz bath for comfort. Avoid moist clothes  for extended period of time. If working out in gym clothes or swim suits for long periods of time change underwear or bottoms of swimsuit if possible, to keep area from being moist. Coconut Oil use for skin protection prior to activity can be used to  external skin for protection, and prevention skin break down once cracking has healed. Check with fiancee to see if he is having symptoms also. Questions addressed.  Rx: Nystatin cream see order with instructions. Instructed to obtain 1% Hydrocortisone cream OTC and mix equal amounts with Nystatin cream and apply thinly on vulva area as directed. Lab: affirm if positive will do Diflucan.  Rv prn

## 2019-01-04 ENCOUNTER — Other Ambulatory Visit: Payer: Self-pay

## 2019-01-04 LAB — VAGINITIS/VAGINOSIS, DNA PROBE
Candida Species: NEGATIVE
Gardnerella vaginalis: POSITIVE — AB
Trichomonas vaginosis: NEGATIVE

## 2019-01-04 MED ORDER — TINIDAZOLE 500 MG PO TABS: 500.0000 mg | ORAL_TABLET | Freq: Two times a day (BID) | ORAL | 0 refills | Status: DC

## 2019-01-04 NOTE — Telephone Encounter (Signed)
Patient notified of results & rx sent in.

## 2019-01-04 NOTE — Telephone Encounter (Signed)
Sonya Edwards, CNM  [name], CMA        Notify patient that her vaginal screen was positive for BV only. Yeast and trichomonas were negative. Continue external cream as given when she was here. Will need Rx Tindamax 500 mg bid x 5 days avoid ETOH during treatment    lmtcb

## 2019-03-01 ENCOUNTER — Telehealth: Payer: Self-pay | Admitting: Certified Nurse Midwife

## 2019-03-01 NOTE — Telephone Encounter (Signed)
Call returned to patient, left detailed message, ok per dpr. Advised OV or WebEx visit recommended, please return call to office for scheduling.

## 2019-03-01 NOTE — Telephone Encounter (Signed)
Patient sent the following correspondence through [place].  Dr. Hollice Espy, I have some concerns with taking the birth control pill that I have been on since I was 62, now almost 28. In the last few years, I have had increased mood swings, a low (almost none at all) sex drive, and my body just feels very tired, and sluggish. I have also struggled with depression for several years, and feel as if the birth control may be affecting that. I'm not on any medications besides my birth control, and have been considering stopping it. I have been taking it for several years, without missing a single day, and would like to see how my body and mind feels without being on it. I feel odd taking a medication like this for so long, not know the affects it has on the body and brain. However, I *of course* do not want to get pregnant. I didn't know if this is something we could discuss via my chart, or if I need to wait until my annual in November. What are your thoughts?

## 2019-03-07 NOTE — Telephone Encounter (Signed)
Left detailed message advising OV or WebEx needed, no return call to date. Patient is scheduled for AEX 04/12/19.   Routing to [employer], CNM FYI.   Encounter closed.

## 2019-03-23 ENCOUNTER — Other Ambulatory Visit: Payer: Self-pay | Admitting: Certified Nurse Midwife

## 2019-03-23 ENCOUNTER — Telehealth: Payer: Self-pay | Admitting: Certified Nurse Midwife

## 2019-03-23 DIAGNOSIS — Z3041 Encounter for surveillance of contraceptive pills: Secondary | ICD-10-CM

## 2019-03-23 NOTE — Telephone Encounter (Signed)
[facility]  Phone Number: [phone]        Hi! My pharmacy reached out to me to let me know I have 0 refills left on my birth control. I have my annul visit coming up with you on November 4th and was hoping I could get 1 refill, so I can continue using it. I am currently on my cycle, and will need to start my new birth control pack this coming Sunday.

## 2019-03-23 NOTE — Telephone Encounter (Signed)
Medication refill request: Blisovi Last AEX:  OV: 01/03/2019 DL Next AEX: 04/12/2019 Last MMG (if hormonal medication request): n/a Refill authorized: Pending #[phone] with no refills if appropriate. Please advise.

## 2019-04-06 NOTE — Telephone Encounter (Signed)
Refill approved and sent in. I will close the encounter.

## 2019-04-06 NOTE — Telephone Encounter (Signed)
Is this encounter still open? 

## 2019-04-11 ENCOUNTER — Other Ambulatory Visit: Payer: Self-pay

## 2019-04-12 ENCOUNTER — Ambulatory Visit (INDEPENDENT_AMBULATORY_CARE_PROVIDER_SITE_OTHER): Payer: BC Managed Care – PPO | Admitting: Certified Nurse Midwife

## 2019-04-12 ENCOUNTER — Other Ambulatory Visit: Payer: Self-pay

## 2019-04-12 ENCOUNTER — Encounter: Payer: Self-pay | Admitting: Certified Nurse Midwife

## 2019-04-12 VITALS — BP 106/60 | HR 68 | Temp 97.1°F | Resp 16 | Ht 63.25 in | Wt 152.0 lb

## 2019-04-12 DIAGNOSIS — Z01419 Encounter for gynecological examination (general) (routine) without abnormal findings: Secondary | ICD-10-CM

## 2019-04-12 DIAGNOSIS — Z113 Encounter for screening for infections with a predominantly sexual mode of transmission: Secondary | ICD-10-CM

## 2019-04-12 DIAGNOSIS — Z23 Encounter for immunization: Secondary | ICD-10-CM

## 2019-04-12 DIAGNOSIS — Z3041 Encounter for surveillance of contraceptive pills: Secondary | ICD-10-CM | POA: Diagnosis not present

## 2019-04-12 MED ORDER — NORETHIN ACE-ETH ESTRAD-FE 1.5-30 MG-MCG PO TABS: 1.0000 | ORAL_TABLET | Freq: Every day | ORAL | 12 refills | Status: DC

## 2019-04-12 NOTE — Progress Notes (Signed)
23 y.o. G0P0000 Single  Caucasian Fe here for annual exam. Periods normal, no issues. Same partner no changes, no STD concerns. Feels she has had decease in libido, and would like information regarding. OCP working well for contraception, desires continuance. No other health issues today.  Patient's last menstrual period was 03/20/2019 (exact date).          Sexually active: Yes.    The current method of family planning is OCP (estrogen/progesterone).    Exercising: No.  exercise Smoker:  no  Review of Systems  Constitutional: Negative.   HENT: Negative.   Eyes: Negative.   Respiratory: Negative.   Cardiovascular: Negative.   Gastrointestinal: Negative.   Genitourinary: Negative.   Musculoskeletal: Negative.   Skin: Negative.   Neurological: Negative.   Endo/Heme/Allergies: Negative.   Psychiatric/Behavioral: Negative.     Health Maintenance: Pap:  04-06-18 neg History of Abnormal Pap: no MMG:  04-22-18 u/s of right breast (axilla) birads 1:neg Self Breast exams: yes Colonoscopy:  none BMD:   none TDaP:  2009 Shingles: no Pneumonia: no Hep C and HIV: not done Labs: if needed   reports that she has never smoked. She has never used smokeless tobacco. She reports current alcohol use. She reports that she does not use drugs.  Past Medical History:  Diagnosis Date  . Anxiety   . Depression     Past Surgical History:  Procedure Laterality Date  . Anxiety    . WISDOM TOOTH EXTRACTION Bilateral 2016    Current Outpatient Medications  Medication Sig Dispense Refill  . norethindrone-ethinyl estradiol-iron (BLISOVI FE 1.5/30) 1.5-30 MG-MCG tablet Take 1 tablet by mouth daily. 28 tablet 12   No current facility-administered medications for this visit.     Family History  Problem Relation Age of Onset  . Diabetes Maternal Grandmother   . Thyroid disease Paternal Grandmother   . Diabetes Paternal Grandfather   . Stroke Paternal Grandfather     ROS:  Pertinent items  are noted in HPI.  Otherwise, a comprehensive ROS was negative.  Exam:   BP 106/60   Pulse 68   Temp (!) 97.1 F (36.2 C) (Skin)   Resp 16   Ht 5' 3.25" (1.607 m)   Wt 152 lb (68.9 kg)   LMP 03/20/2019 (Exact Date)   BMI 26.71 kg/m  Height: 5' 3.25" (160.7 cm) Ht Readings from Last 3 Encounters:  04/12/19 5' 3.25" (1.607 m)  04/06/18 5' 3.25" (1.607 m)  12/30/17 5\' 4"  (1.626 m)    General appearance: alert, cooperative and appears stated age Head: Normocephalic, without obvious abnormality, atraumatic Neck: no adenopathy, supple, symmetrical, trachea midline and thyroid normal to inspection and palpation Lungs: clear to auscultation bilaterally Breasts: normal appearance, no masses or tenderness, No nipple retraction or dimpling, No nipple discharge or bleeding, No axillary or supraclavicular adenopathy Heart: regular rate and rhythm Abdomen: soft, non-tender; no masses,  no organomegaly Extremities: extremities normal, atraumatic, no cyanosis or edema Skin: Skin color, texture, turgor normal. No rashes or lesions Lymph nodes: Cervical, supraclavicular, and axillary nodes normal. No abnormal inguinal nodes palpated Neurologic: Grossly normal   Pelvic: External genitalia:  no lesions              Urethra:  normal appearing urethra with no masses, tenderness or lesions              Bartholin's and Skene's: normal                 Vagina:  normal appearing vagina with normal color and discharge, no lesions              Cervix: no cervical motion tenderness and no lesions              Pap taken: No. Bimanual Exam:  Uterus:  normal size, contour, position, consistency, mobility, non-tender              Adnexa: normal adnexa and no mass, fullness, tenderness               Rectovaginal: Confirms               Anus:  normal appearance no lesions  Chaperone present: yes  A:  Well Woman with normal exam  Contraception OCP desired  Decrease libido  STD screening vaginal  only  Immunization due TDAP  P:   Reviewed health and wellness pertinent to exam  Risks/benefits/warning signs with OCP use reviewed. Desires continuance.  Rx Blisovi Fe see order with instructions  Lab: GC/chlamydia, Affirm  Requests TDAP  Pap smear: no   counseled on breast self exam, STD prevention, HIV risk factors and prevention, use and side effects of OCP's, adequate intake of calcium and vitamin D, diet and exercise  return annually or prn  An After Visit Summary was printed and given to the patient.

## 2019-04-12 NOTE — Patient Instructions (Signed)
General topics  Next pap or exam is  due in 1 year Take a Women's multivitamin Take 1200 mg. of calcium daily - prefer dietary If any concerns in interim to call back  Breast Self-Awareness Practicing breast self-awareness may pick up problems early, prevent significant medical complications, and possibly save your life. By practicing breast self-awareness, you can become familiar with how your breasts look and feel and if your breasts are changing. This allows you to notice changes early. It can also offer you some reassurance that your breast health is good. One way to learn what is normal for your breasts and whether your breasts are changing is to do a breast self-exam. If you find a lump or something that was not present in the past, it is best to contact your caregiver right away. Other findings that should be evaluated by your caregiver include nipple discharge, especially if it is bloody; skin changes or reddening; areas where the skin seems to be pulled in (retracted); or new lumps and bumps. Breast pain is seldom associated with cancer (malignancy), but should also be evaluated by a caregiver. BREAST SELF-EXAM The best time to examine your breasts is 5 7 days after your menstrual period is over.  ExitCare Patient Information 2013 [place].   Exercise to Stay Healthy Exercise helps you become and stay healthy. EXERCISE IDEAS AND TIPS Choose exercises that:  You enjoy.  Fit into your day. You do not need to exercise really hard to be healthy. You can do exercises at a slow or medium level and stay healthy. You can:  Stretch before and after working out.  Try yoga, Pilates, or tai chi.  Lift weights.  Walk fast, swim, jog, run, climb stairs, bicycle, dance, or rollerskate.  Take aerobic classes. Exercises that burn about 150 calories:  Running 1  miles in 15 minutes.  Playing volleyball for 45 to 60 minutes.  Washing and waxing a car for 45 to 60  minutes.  Playing touch football for 45 minutes.  Walking 1  miles in 35 minutes.  Pushing a stroller 1  miles in 30 minutes.  Playing basketball for 30 minutes.  Raking leaves for 30 minutes.  Bicycling 5 miles in 30 minutes.  Walking 2 miles in 30 minutes.  Dancing for 30 minutes.  Shoveling snow for 15 minutes.  Swimming laps for 20 minutes.  Walking up stairs for 15 minutes.  Bicycling 4 miles in 15 minutes.  Gardening for 30 to 45 minutes.  Jumping rope for 15 minutes.  Washing windows or floors for 45 to 60 minutes. Document Released: 06/27/2010 Document Revised: 08/17/2011 Document Reviewed: 06/27/2010 [facility] Patient Information 2013 [place].   Other topics ( that may be useful information):    Sexually Transmitted Disease Sexually transmitted disease (STD) refers to any infection that is passed from person to person during sexual activity. This may happen by way of saliva, semen, blood, vaginal mucus, or urine. Common STDs include:  Gonorrhea.  Chlamydia.  Syphilis.  HIV/AIDS.  Genital herpes.  Hepatitis B and C.  Trichomonas.  Human papillomavirus (HPV).  Pubic lice. CAUSES  An STD may be spread by bacteria, virus, or parasite. A person can get an STD by:  Sexual intercourse with an infected person.  Sharing sex toys with an infected person.  Sharing needles with an infected person.  Having intimate contact with the genitals, mouth, or rectal areas of an infected person. SYMPTOMS  Some people may  they can still pass the infection to others. Different STDs have different symptoms. Symptoms include: °· Painful or bloody urination. °· Pain in the pelvis, abdomen, vagina, anus, throat, or eyes. °· Skin rash, itching, irritation, growths, or sores (lesions). These usually occur in the genital or anal area. °· Abnormal vaginal discharge. °· Penile discharge in men. °· Soft, flesh-colored skin growths in the  genital or anal area. °· Fever. °· Pain or bleeding during sexual intercourse. °· Swollen glands in the groin area. °· Yellow skin and eyes (jaundice). This is seen with hepatitis. °DIAGNOSIS  °To make a diagnosis, your caregiver may: °· Take a medical history. °· Perform a physical exam. °· Take a specimen (culture) to be examined. °· Examine a sample of discharge under a microscope. °· Perform blood test °TREATMENT  °· Chlamydia, gonorrhea, trichomonas, and syphilis can be cured with antibiotic medicine. °· Genital herpes, hepatitis, and HIV can be treated, but not cured, with prescribed medicines. The medicines will lessen the symptoms. °· Genital warts from HPV can be treated with medicine or by freezing, burning (electrocautery), or surgery. Warts may come back. °· HPV is a virus and cannot be cured with medicine or surgery. However, abnormal areas may be followed very closely by your caregiver and may be removed from the cervix, vagina, or vulva through office procedures or surgery. °If your diagnosis is confirmed, your recent sexual partners need treatment. This is true even if they are symptom-free or have a negative culture or evaluation. They should not have sex until their caregiver says it is okay. °HOME CARE INSTRUCTIONS °· All sexual partners should be informed, tested, and treated for all STDs. °· Take your antibiotics as directed. Finish them even if you start to feel better. °· Only take over-the-counter or prescription medicines for pain, discomfort, or fever as directed by your caregiver. °· Rest. °· Eat a balanced diet and drink enough fluids to keep your urine clear or pale yellow. °· Do not have sex until treatment is completed and you have followed up with your caregiver. STDs should be checked after treatment. °· Keep all follow-up appointments, Pap tests, and blood tests as directed by your caregiver. °· Only use latex condoms and water-soluble lubricants during sexual activity. Do not use  petroleum jelly or oils. °· Avoid alcohol and illegal drugs. °· Get vaccinated for HPV and hepatitis. If you have not received these vaccines in the past, talk to your caregiver about whether one or both might be right for you. °· Avoid risky sex practices that can break the skin. °The only way to avoid getting an STD is to avoid all sexual activity. Latex condoms and dental dams (for oral sex) will help lessen the risk of getting an STD, but will not completely eliminate the risk. °SEEK MEDICAL CARE IF:  °· You have a fever. °· You have any new or worsening symptoms. °Document Released: 08/15/2002 Document Revised: 08/17/2011 Document Reviewed: 08/22/2010 °ExitCare® Patient Information ©2013 [employer]. ° ° ° °Domestic Abuse °You are being battered or abused if someone close to you hits, pushes, or physically hurts you in any way. You also are being abused if you are forced into activities. You are being sexually abused if you are forced to have sexual contact of any kind. You are being emotionally abused if you are made to feel worthless or if you are constantly threatened. It is important to remember that help is available. No one has the right to abuse you. °PREVENTION OF FURTHER   abuse you. PREVENTION OF FURTHER ABUSE  Learn the warning signs of danger. This varies with situations but may include: the use of alcohol, threats, isolation from friends and family, or forced sexual contact. Leave if you feel that violence is going to occur.  If you are attacked or beaten, report it to the police so the abuse is documented. You do not have to press charges. The police can protect you while you or the attackers are leaving. Get the officer's name and badge number and a copy of the report.  Find someone you can trust and tell them what is happening to you: your caregiver, a nurse, clergy member, close friend or family member. Feeling ashamed is natural, but remember that you have done nothing wrong. No one deserves abuse. Document Released:  05/22/2000 Document Revised: 08/17/2011 Document Reviewed: 07/31/2010 [facility] Patient Information 2013 [place].    How Much is Too Much Alcohol? Drinking too much alcohol can cause injury, accidents, and health problems. These types of problems can include:   Car crashes.  Falls.  Family fighting (domestic violence).  Drowning.  Fights.  Injuries.  Burns.  Damage to certain organs.  Having a baby with birth defects. ONE DRINK CAN BE TOO MUCH WHEN YOU ARE:  Working.  Pregnant or breastfeeding.  Taking medicines. Ask your doctor.  Driving or planning to drive. If you or someone you know has a drinking problem, get help from a doctor.  Document Released: 03/21/2009 Document Revised: 08/17/2011 Document Reviewed: 03/21/2009 [facility] Patient Information 2013 [place].   Smoking Hazards Smoking cigarettes is extremely bad for your health. Tobacco smoke has over 200 known poisons in it. There are over 60 chemicals in tobacco smoke that cause cancer. Some of the chemicals found in cigarette smoke include:   Cyanide.  Benzene.  Formaldehyde.  Methanol (wood alcohol).  Acetylene (fuel used in welding torches).  Ammonia. Cigarette smoke also contains the poisonous gases nitrogen oxide and carbon monoxide.  Cigarette smokers have an increased risk of many serious medical problems and Smoking causes approximately:  90% of all lung cancer deaths in men.  80% of all lung cancer deaths in women.  90% of deaths from chronic obstructive lung disease. Compared with nonsmokers, smoking increases the risk of:  Coronary heart disease by 2 to 4 times.  Stroke by 2 to 4 times.  Men developing lung cancer by 23 times.  Women developing lung cancer by 13 times.  Dying from chronic obstructive lung diseases by 12 times.  . Smoking is the most preventable cause of death and disease in our society.  WHY IS SMOKING ADDICTIVE?  Nicotine is the chemical  agent in tobacco that is capable of causing addiction or dependence.  When you smoke and inhale, nicotine is absorbed rapidly into the bloodstream through your lungs. Nicotine absorbed through the lungs is capable of creating a powerful addiction. Both inhaled and non-inhaled nicotine may be addictive.  Addiction studies of cigarettes and spit tobacco show that addiction to nicotine occurs mainly during the teen years, when young people begin using tobacco products. WHAT ARE THE BENEFITS OF QUITTING?  There are many health benefits to quitting smoking.   Likelihood of developing cancer and heart disease decreases. Health improvements are seen almost immediately.  Blood pressure, pulse rate, and breathing patterns start returning to normal soon after quitting. QUITTING SMOKING   American Lung Association - 1-800-LUNGUSA  American Cancer Society - 1-800-ACS-2345 Document Released: 07/02/2004 Document Revised: 08/17/2011 Document Reviewed: 03/06/2009  ExitCare Patient Information 2013 [place].   Stress Management Stress is a state of physical or mental tension that often results from changes in your life or normal routine. Some common causes of stress are:  Death of a loved one.  Injuries or severe illnesses.  Getting fired or changing jobs.  Moving into a new home. Other causes may be:  Sexual problems.  Business or financial losses.  Taking on a large debt.  Regular conflict with someone at home or at work.  Constant tiredness from lack of sleep. It is not just bad things that are stressful. It may be stressful to:  Win the lottery.  Get married.  Buy a new car. The amount of stress that can be easily tolerated varies from person to person. Changes generally cause stress, regardless of the types of change. Too much stress can affect your health. It may lead to physical or emotional problems. Too little stress (boredom) may also become stressful. SUGGESTIONS TO  REDUCE STRESS:  Talk things over with your family and friends. It often is helpful to share your concerns and worries. If you feel your problem is serious, you may want to get help from a professional counselor.  Consider your problems one at a time instead of lumping them all together. Trying to take care of everything at once may seem impossible. List all the things you need to do and then start with the most important one. Set a goal to accomplish 2 or 3 things each day. If you expect to do too many in a single day you will naturally fail, causing you to feel even more stressed.  Do not use alcohol or drugs to relieve stress. Although you may feel better for a short time, they do not remove the problems that caused the stress. They can also be habit forming.  Exercise regularly - at least 3 times per week. Physical exercise can help to relieve that "uptight" feeling and will relax you.  The shortest distance between despair and hope is often a good night's sleep.  Go to bed and get up on time allowing yourself time for appointments without being rushed.  Take a short "time-out" period from any stressful situation that occurs during the day. Close your eyes and take some deep breaths. Starting with the muscles in your face, tense them, hold it for a few seconds, then relax. Repeat this with the muscles in your neck, shoulders, hand, stomach, back and legs.  Take good care of yourself. Eat a balanced diet and get plenty of rest.  Schedule time for having fun. Take a break from your daily routine to relax. HOME CARE INSTRUCTIONS   Call if you feel overwhelmed by your problems and feel you can no longer manage them on your own.  Return immediately if you feel like hurting yourself or someone else. Document Released: 11/18/2000 Document Revised: 08/17/2011 Document Reviewed: 07/11/2007 [facility] Patient Information 2013 [place].

## 2019-04-13 LAB — GC/CHLAMYDIA PROBE AMP
Chlamydia trachomatis, NAA: NEGATIVE
Neisseria Gonorrhoeae by PCR: NEGATIVE

## 2019-04-13 LAB — VAGINITIS/VAGINOSIS, DNA PROBE
Candida Species: NEGATIVE
Gardnerella vaginalis: NEGATIVE
Trichomonas vaginosis: NEGATIVE

## 2019-08-15 ENCOUNTER — Telehealth: Payer: Self-pay | Admitting: Certified Nurse Midwife

## 2019-08-15 NOTE — Telephone Encounter (Signed)
Patient sent the following correspondence through [facility].  Hi! Since Sunday morning I have noticed an odor (not very strong but not my normal scent) from my vagina. I have had a small amount of discharge, it's a very light yellow color (almost white). Itches occasionally, but is not uncomfortable. What should I do for this? I have to work all day for the rest of the week. If I need to come in, I can possibly work it out but if I could get something without being seen that would be ideal. Thank you so much for always being helpful!

## 2019-08-15 NOTE — Telephone Encounter (Signed)
Spoke to pt. Pt states having odor with slight discharge light yellow in color for last 3 days. Pt advised to be seen. Pt agreeable. Pt scheduled OV with Debbi, CNM on 08/16/2019 at 1100am. Pt verbalized understanding. CPS Neg.   Routing to D. Hollice Espy, CNM for review and will close encounter.

## 2019-08-15 NOTE — Progress Notes (Signed)
24 y.o. Single Caucasian female [id number] here with complaint of vaginal symptoms of itching off and on, and increase slightly thick odorous discharge. Onset of symptoms 3 days ago. Used new brand of tampon and also had sexual activity prior to occurrence. No STD concerns. Urinary symptoms none . Contraception is OCP. No partner change, he is her fiance.. No other health issues.  Review of Systems  Constitutional: Negative.   HENT: Negative.   Eyes: Negative.   Respiratory: Negative.   Cardiovascular: Negative.   Gastrointestinal: Negative.   Genitourinary: Negative.   Musculoskeletal: Negative.   Skin:       Vaginal discharge, occasional itching, irritation  Neurological: Negative.   Endo/Heme/Allergies: Negative.   Psychiatric/Behavioral: Negative.     O:Healthy female WDWN Affect: normal, orientation x 3  Exam: Skin: warm and dry Abdomen: soft, non tender, no masses  inguinal Lymph nodes: no enlargement or tenderness Pelvic exam: External genital: normal female no scaling BUS: negative Vagina: watery white non odorous discharge noted.  Affirm taken Cervix: normal, non tender, no CMT Uterus: normal, non tender Adnexa:normal, non tender, no masses or fullness noted  A:Vaginitis Contraception OCP   P:Discussed findings of possible yeast vaginitis and etiology. Discussed Aveeno or baking soda sitz bath for comfort. Avoid moist clothes or pads for extended period of time. If working out in gym clothes, for long periods of time change underwear.    Rv prn

## 2019-08-16 ENCOUNTER — Ambulatory Visit (INDEPENDENT_AMBULATORY_CARE_PROVIDER_SITE_OTHER): Payer: BC Managed Care – PPO | Admitting: Certified Nurse Midwife

## 2019-08-16 ENCOUNTER — Other Ambulatory Visit: Payer: Self-pay

## 2019-08-16 ENCOUNTER — Encounter: Payer: Self-pay | Admitting: Certified Nurse Midwife

## 2019-08-16 VITALS — BP 100/64 | HR 68 | Temp 98.4°F | Resp 16 | Wt 153.0 lb

## 2019-08-16 DIAGNOSIS — N760 Acute vaginitis: Secondary | ICD-10-CM | POA: Diagnosis not present

## 2019-08-16 DIAGNOSIS — N898 Other specified noninflammatory disorders of vagina: Secondary | ICD-10-CM | POA: Diagnosis not present

## 2019-08-16 NOTE — Patient Instructions (Signed)
Vaginal Yeast Infection, Adult  Vaginal yeast infection is a condition that causes vaginal discharge as well as soreness, swelling, and redness (inflammation) of the vagina. This is a common condition. Some women get this infection frequently. What are the causes? This condition is caused by a change in the normal balance of the yeast (candida) and bacteria that live in the vagina. This change causes an overgrowth of yeast, which causes the inflammation. What increases the risk? The condition is more likely to develop in women who:  Take antibiotic medicines.  Have diabetes.  Take birth control pills.  Are pregnant.  Douche often.  Have a weak body defense system (immune system).  Have been taking steroid medicines for a long time.  Frequently wear tight clothing. What are the signs or symptoms? Symptoms of this condition include:  White, thick, creamy vaginal discharge.  Swelling, itching, redness, and irritation of the vagina. The lips of the vagina (vulva) may be affected as well.  Pain or a burning feeling while urinating.  Pain during sex. How is this diagnosed? This condition is diagnosed based on:  Your medical history.  A physical exam.  A pelvic exam. Your health care provider will examine a sample of your vaginal discharge under a microscope. Your health care provider may send this sample for testing to confirm the diagnosis. How is this treated? This condition is treated with medicine. Medicines may be over-the-counter or prescription. You may be told to use one or more of the following:  Medicine that is taken by mouth (orally).  Medicine that is applied as a cream (topically).  Medicine that is inserted directly into the vagina (suppository). Follow these instructions at home:  Lifestyle  Do not have sex until your health care provider approves. Tell your sex partner that you have a yeast infection. That person should go to his or her health care  provider and ask if they should also be treated.  Do not wear tight clothes, such as pantyhose or tight pants.  Wear breathable cotton underwear. General instructions  Take or apply over-the-counter and prescription medicines only as told by your health care provider.  Eat more yogurt. This may help to keep your yeast infection from returning.  Do not use tampons until your health care provider approves.  Try taking a sitz bath to help with discomfort. This is a warm water bath that is taken while you are sitting down. The water should only come up to your hips and should cover your buttocks. Do this 3-4 times per day or as told by your health care provider.  Do not douche.  If you have diabetes, keep your blood sugar levels under control.  Keep all follow-up visits as told by your health care provider. This is important. Contact a health care provider if:  You have a fever.  Your symptoms go away and then return.  Your symptoms do not get better with treatment.  Your symptoms get worse.  You have new symptoms.  You develop blisters in or around your vagina.  You have blood coming from your vagina and it is not your menstrual period.  You develop pain in your abdomen. Summary  Vaginal yeast infection is a condition that causes discharge as well as soreness, swelling, and redness (inflammation) of the vagina.  This condition is treated with medicine. Medicines may be over-the-counter or prescription.  Take or apply over-the-counter and prescription medicines only as told by your health care provider.  Do not douche.   Do not have sex or use tampons until your health care provider approves.  Contact a health care provider if your symptoms do not get better with treatment or your symptoms go away and then return. This information is not intended to replace advice given to you by your health care provider. Make sure you discuss any questions you have with your health care  provider. Document Revised: 12/23/2018 Document Reviewed: 10/11/2017 Elsevier Patient Education  2020 [employer]  

## 2019-08-17 ENCOUNTER — Other Ambulatory Visit: Payer: Self-pay | Admitting: Certified Nurse Midwife

## 2019-08-17 DIAGNOSIS — B9689 Other specified bacterial agents as the cause of diseases classified elsewhere: Secondary | ICD-10-CM

## 2019-08-17 LAB — VAGINITIS/VAGINOSIS, DNA PROBE
Candida Species: NEGATIVE
Gardnerella vaginalis: POSITIVE — AB
Trichomonas vaginosis: NEGATIVE

## 2019-08-17 MED ORDER — METRONIDAZOLE 500 MG PO TABS: 500.0000 mg | ORAL_TABLET | Freq: Two times a day (BID) | ORAL | 0 refills | Status: DC

## 2019-08-21 ENCOUNTER — Encounter: Payer: Self-pay | Admitting: Certified Nurse Midwife

## 2019-08-23 ENCOUNTER — Encounter: Payer: Self-pay | Admitting: Certified Nurse Midwife

## 2019-11-20 ENCOUNTER — Other Ambulatory Visit: Payer: Self-pay

## 2019-11-20 ENCOUNTER — Ambulatory Visit (INDEPENDENT_AMBULATORY_CARE_PROVIDER_SITE_OTHER): Payer: BC Managed Care – PPO | Admitting: Obstetrics and Gynecology

## 2019-11-20 ENCOUNTER — Telehealth: Payer: Self-pay | Admitting: Obstetrics and Gynecology

## 2019-11-20 ENCOUNTER — Encounter: Payer: Self-pay | Admitting: Obstetrics and Gynecology

## 2019-11-20 VITALS — BP 112/74 | HR 70 | Temp 97.4°F | Ht 63.25 in | Wt 157.4 lb

## 2019-11-20 DIAGNOSIS — N926 Irregular menstruation, unspecified: Secondary | ICD-10-CM | POA: Diagnosis not present

## 2019-11-20 LAB — POCT URINE PREGNANCY: Preg Test, Ur: NEGATIVE

## 2019-11-20 NOTE — Progress Notes (Signed)
GYNECOLOGY  VISIT   HPI: 24 y.o.   Single  Caucasian  female   G0P0000 with Patient's last menstrual period was 10/12/2019 (exact date).   here for missed menses. Stopped OCPs 2 months ago. Wants to know if she is pregnant.  Her April menses was 09/12/19. Her last birth control pill was the Saturday before this.   Has some nausea in the am.  Lightheaded.  Lower abdominal cramping.   Has been on birth control pills since age 77.  She wanted to take a break from pills.  Would be ok if she got pregnant.   UPT: Neg   GYNECOLOGIC HISTORY: Patient's last menstrual period was 10/12/2019 (exact date). Contraception: None Menopausal hormone therapy:  n/a Last mammogram: 04-22-18 u/s of right breast (axilla) birads 1:neg  Last pap smear:  04-06-18 neg        OB History    Gravida  0   Para  0   Term  0   Preterm  0   AB  0   Living  0     SAB  0   TAB  0   Ectopic  0   Multiple  0   Live Births                 There are no problems to display for this patient.   Past Medical History:  Diagnosis Date  . Anxiety   . Depression     Past Surgical History:  Procedure Laterality Date  . Anxiety    . WISDOM TOOTH EXTRACTION Bilateral 2016    No current outpatient medications on file.   No current facility-administered medications for this visit.     ALLERGIES: Patient has no known allergies.  Family History  Problem Relation Age of Onset  . Diabetes Maternal Grandmother   . Thyroid disease Paternal Grandmother   . Diabetes Paternal Grandfather   . Stroke Paternal Grandfather     Social History   Socioeconomic History  . Marital status: Single    Spouse name: Not on file  . Number of children: Not on file  . Years of education: Not on file  . Highest education level: Not on file  Occupational History  . Not on file  Tobacco Use  . Smoking status: Never Smoker  . Smokeless tobacco: Never Used  Substance and Sexual Activity  . Alcohol  use: Yes    Alcohol/week: 0.0 - 1.0 standard drinks  . Drug use: No  . Sexual activity: Yes    Partners: Male    Birth control/protection: OCP  Other Topics Concern  . Not on file  Social History Narrative  . Not on file   Social Determinants of Health   Financial Resource Strain:   . Difficulty of Paying Living Expenses:   Food Insecurity:   . Worried About Charity fundraiser in the Last Year:   . Arboriculturist in the Last Year:   Transportation Needs:   . Film/video editor (Medical):   [name] Lack of Transportation (Non-Medical):   Physical Activity:   . Days of Exercise per Week:   . Minutes of Exercise per Session:   Stress:   . Feeling of Stress :   Social Connections:   . Frequency of Communication with Friends and Family:   . Frequency of Social Gatherings with Friends and Family:   . Attends Religious Services:   . Active Member of Clubs or Organizations:   .  Attends Archivist Meetings:   [name] Marital Status:   Intimate Partner Violence:   . Fear of Current or Ex-Partner:   . Emotionally Abused:   [name] Physically Abused:   . Sexually Abused:     Review of Systems  Gastrointestinal: Positive for abdominal pain (abdominal cramping) and nausea.  Neurological:       Lightheaded in AM  All other systems reviewed and are negative.   PHYSICAL EXAMINATION:    BP 112/74   Pulse 70   Temp (!) 97.4 F (36.3 C) (Temporal)   Ht 5' 3.25" (1.607 m)   Wt 157 lb 6.4 oz (71.4 kg)   LMP 10/12/2019 (Exact Date)   BMI 27.66 kg/m     General appearance: alert, cooperative and appears stated age  Pelvic: External genitalia:  no lesions              Urethra:  normal appearing urethra with no masses, tenderness or lesions              Bartholins and Skenes: normal                 Vagina: normal appearing vagina with normal color and discharge, no lesions              Cervix: no lesions                Bimanual Exam:  Uterus:  normal size, contour, position,  consistency, mobility, non-tender              Adnexa: no mass, fullness, tenderness            Chaperone was present for exam.  ASSESSMENT  Missed menses.  Desire to determine potential pregnancy status.   PLAN  Return for hCG tomorrow.  Lab is closed this evening.  Potential reasons for missed menses discussed.  Fu prn.   An After Visit Summary was printed and given to the patient.  ___23___ minutes consultation.

## 2019-11-20 NOTE — Telephone Encounter (Signed)
Patient would like to schedule for pregnancy test.

## 2019-11-20 NOTE — Telephone Encounter (Signed)
Spoke with patient. Stopped OCP 2 mo ago. LMP 10/12/19. No menses to date, has taken several UPT, all negative, last one taken on 6/13. Reports intermittent nausea, lower abdominal cramping 3/10 and lightheadedness. Denies vomiting, fever/chills. Patient is requesting an OV for pregnancy test.   Last AEX w/ Melvia Heaps, CNM 04/12/19.   Covid 19 prescreen negative, precautions reviewed. OV scheudled for today at 4:30pm with Dr. Quincy Simmonds. Patient has to review her schedule with her employer, will return call to office if she needs to reschedule.  Routing to provider for final review. Patient is agreeable to disposition. Will close encounter.

## 2019-11-21 ENCOUNTER — Telehealth: Payer: Self-pay

## 2019-11-21 ENCOUNTER — Other Ambulatory Visit (INDEPENDENT_AMBULATORY_CARE_PROVIDER_SITE_OTHER): Payer: BC Managed Care – PPO

## 2019-11-21 DIAGNOSIS — N926 Irregular menstruation, unspecified: Secondary | ICD-10-CM

## 2019-11-21 LAB — HCG, QUANTITATIVE, PREGNANCY: HCG, Total, QN: <3 m[IU]/mL

## 2019-11-21 NOTE — Telephone Encounter (Signed)
Patient is calling in regards to lab results.

## 2019-11-21 NOTE — Telephone Encounter (Signed)
Pt calling to get results. Spoke with pt. Pt given results and recommendations per Dr Quincy Simmonds. Pt agreeable and verbalized understanding. Pt to return call to office if no cycle in another week.   Routing to Dr Quincy Simmonds.  Encounter closed.        Nunzio Cobbs, MD  11/21/2019 2:41 PM EDT Back to Top    Please let patient know that her serum hCG is negative.  Please have her call back if she does not get a menstruation in another week.

## 2019-11-21 NOTE — Telephone Encounter (Signed)
Spoke with pt. Pt calling to get Beta HCG results. Pt advised results not back and will return call when resulted. Pt agreeable.

## 2019-11-24 ENCOUNTER — Telehealth: Payer: Self-pay

## 2019-11-24 NOTE — Telephone Encounter (Signed)
Patient is calling in regards to still experiencing "odd symptoms".

## 2019-11-24 NOTE — Telephone Encounter (Signed)
Reviewed with Dr. Sabra Heck. Agree with ER recommendations. Call returned to patient. Advised per Dr. Sabra Heck. Patient verbalizes understanding and is agreeable.

## 2019-11-24 NOTE — Telephone Encounter (Signed)
Spoke with pt. Pt was seen on 11/20/19 with Dr Quincy Simmonds about missed menses. Pt states stopped OCPs in April 2021. Pt states having normal cycle last on 10/12/19. Pt denies any heavy bleeding or clots with last period. Denies abd pain and cramps.  Pt states still experiencing lightheadedness first thing in morning and back pain and states abd being protruded like she is pregnant. Beta HcG <3 on 11/21/19. Pt states feels like "she is having phantom pregnancy signs and symptoms, abd feels tight and uncomfortable" Pt denies NVD or any change in BM.  States nausea went away 2-3 days ago since last OV. Eating and drinking normally and denies any increase in stress for reason of missed menses.    Pt states not sure why feeling pregnant, but does not show in blood work. Pt requesting OV. Offered OV with Dr Quincy Simmonds on 6/21 and declines due to time of appt for work schedule. Pt states wanting to be seen with any provider due to last visits with Debbie,CNM in the past. Pt scheduled with Dr Talbert Nan on 6/21 at 830 am. Pt agreeable and verbalized understanding of date and time of appt. CPS neg.  Pt given ER precautions over weekend with any increase in symptoms.. Pt agreeable.   Routing to Dr Talbert Nan for review.  Encounter closed.

## 2019-11-24 NOTE — Telephone Encounter (Signed)
Patient returned call to office, request to speak with RN, call transferred from front office.   Spoke with patient. Patient reports her abdomen has been increasing in size throughout the day, "looks like I am 5 months pregnant". Reports increased lower back pain, 8/10 after taking OTC Advil. Is uncomfortable to move. Denies N/V, fever/chills. Patient report diarrhea this morning, states this is not uncommon for her. Patient has an OV scheduled for 6/21 at 8:30am with Dr. Talbert Nan. Patient what she needs to do?   Instructed patient to go directly to ER for further evaluation. Advised patient to keep OV as scheduled for f/u, if needed, can return call to office and cancel or change. Patient asking if "phantom pregnancy" is a "thing"? Advised patient I am not familiar with "phantom pregnancy", encouraged patient to go directly to ER for further evaluation of symptoms. Advised patient I will review with covering provider and return call if any additional recommendations. Patient agreeable.   Routing to covering provider, Dr. Sabra Heck.

## 2019-11-27 ENCOUNTER — Ambulatory Visit (INDEPENDENT_AMBULATORY_CARE_PROVIDER_SITE_OTHER): Payer: BC Managed Care – PPO | Admitting: Obstetrics and Gynecology

## 2019-11-27 ENCOUNTER — Encounter: Payer: Self-pay | Admitting: Obstetrics and Gynecology

## 2019-11-27 ENCOUNTER — Other Ambulatory Visit: Payer: Self-pay

## 2019-11-27 VITALS — BP 100/68 | HR 79 | Temp 97.9°F | Ht 64.0 in | Wt 156.0 lb

## 2019-11-27 DIAGNOSIS — M549 Dorsalgia, unspecified: Secondary | ICD-10-CM

## 2019-11-27 DIAGNOSIS — K5900 Constipation, unspecified: Secondary | ICD-10-CM

## 2019-11-27 DIAGNOSIS — N926 Irregular menstruation, unspecified: Secondary | ICD-10-CM

## 2019-11-27 DIAGNOSIS — R140 Abdominal distension (gaseous): Secondary | ICD-10-CM | POA: Diagnosis not present

## 2019-11-27 MED ORDER — IBUPROFEN 800 MG PO TABS
800.0000 mg | ORAL_TABLET | Freq: Three times a day (TID) | ORAL | 1 refills | Status: DC | PRN
Start: 2019-11-27 — End: 2020-07-29

## 2019-11-27 NOTE — Patient Instructions (Signed)
Acute Back Pain, Adult Acute back pain is sudden and usually short-lived. It is often caused by an injury to the muscles and tissues in the back. The injury may result from:  A muscle or ligament getting overstretched or torn (strained). Ligaments are tissues that connect bones to each other. Lifting something improperly can cause a back strain.  Wear and tear (degeneration) of the spinal disks. Spinal disks are circular tissue that provides cushioning between the bones of the spine (vertebrae).  Twisting motions, such as while playing sports or doing yard work.  A hit to the back.  Arthritis. You may have a physical exam, lab tests, and imaging tests to find the cause of your pain. Acute back pain usually goes away with rest and home care. Follow these instructions at home: Managing pain, stiffness, and swelling  Take over-the-counter and prescription medicines only as told by your health care provider.  Your health care provider may recommend applying ice during the first 24-48 hours after your pain starts. To do this: ? Put ice in a plastic bag. ? Place a towel between your skin and the bag. ? Leave the ice on for 20 minutes, 2-3 times a day.  If directed, apply heat to the affected area as often as told by your health care provider. Use the heat source that your health care provider recommends, such as a moist heat pack or a heating pad. ? Place a towel between your skin and the heat source. ? Leave the heat on for 20-30 minutes. ? Remove the heat if your skin turns bright red. This is especially important if you are unable to feel pain, heat, or cold. You have a greater risk of getting burned. Activity   Do not stay in bed. Staying in bed for more than 1-2 days can delay your recovery.  Sit up and stand up straight. Avoid leaning forward when you sit, or hunching over when you stand. ? If you work at a desk, sit close to it so you do not need to lean over. Keep your chin tucked  in. Keep your neck drawn back, and keep your elbows bent at a right angle. Your arms should look like the letter "L." ? Sit high and close to the steering wheel when you drive. Add lower back (lumbar) support to your car seat, if needed.  Take short walks on even surfaces as soon as you are able. Try to increase the length of time you walk each day.  Do not sit, drive, or stand in one place for more than 30 minutes at a time. Sitting or standing for long periods of time can put stress on your back.  Do not drive or use heavy machinery while taking prescription pain medicine.  Use proper lifting techniques. When you bend and lift, use positions that put less stress on your back: ? Bend your knees. ? Keep the load close to your body. ? Avoid twisting.  Exercise regularly as told by your health care provider. Exercising helps your back heal faster and helps prevent back injuries by keeping muscles strong and flexible.  Work with a physical therapist to make a safe exercise program, as recommended by your health care provider. Do any exercises as told by your physical therapist. Lifestyle  Maintain a healthy weight. Extra weight puts stress on your back and makes it difficult to have good posture.  Avoid activities or situations that make you feel anxious or stressed. Stress and anxiety increase muscle   tension and can make back pain worse. Learn ways to manage anxiety and stress, such as through exercise. General instructions  Sleep on a firm mattress in a comfortable position. Try lying on your side with your knees slightly bent. If you lie on your back, put a pillow under your knees.  Follow your treatment plan as told by your health care provider. This may include: ? Cognitive or behavioral therapy. ? Acupuncture or massage therapy. ? Meditation or yoga. Contact a health care provider if:  You have pain that is not relieved with rest or medicine.  You have increasing pain going down  into your legs or buttocks.  Your pain does not improve after 2 weeks.  You have pain at night.  You lose weight without trying.  You have a fever or chills. Get help right away if:  You develop new bowel or bladder control problems.  You have unusual weakness or numbness in your arms or legs.  You develop nausea or vomiting.  You develop abdominal pain.  You feel faint. Summary  Acute back pain is sudden and usually short-lived.  Use proper lifting techniques. When you bend and lift, use positions that put less stress on your back.  Take over-the-counter and prescription medicines and apply heat or ice as directed by your health care provider. This information is not intended to replace advice given to you by your health care provider. Make sure you discuss any questions you have with your health care provider. Document Revised: 09/13/2018 Document Reviewed: 01/06/2017 Elsevier Patient Education  [place]  About Constipation  Constipation Overview Constipation is the most common gastrointestinal complaint -- about 4 million Americans experience constipation and make 2.5 million physician visits a year to get help for the problem.  Constipation can occur when the colon absorbs too much water, the colon's muscle contraction is slow or sluggish, and/or there is delayed transit time through the colon.  The result is stool that is hard and dry.  Indicators of constipation include straining during bowel movements greater than 25% of the time, having fewer than three bowel movements per week, and/or the feeling of incomplete evacuation.  There are established guidelines ([place] II ) for defining constipation. A person needs to have two or more of the following symptoms for at least 12 weeks (not necessarily consecutive) in the preceding 12 months: . Straining in  greater than 25% of bowel movements . Lumpy or hard stools in greater than 25%  of bowel movements . Sensation of incomplete emptying in greater than 25% of bowel movements . Sensation of anorectal obstruction/blockade in greater than 25% of bowel movements . Manual maneuvers to help empty greater than 25% of bowel movements (e.g., digital evacuation, support of the pelvic floor)  . Less than  3 bowel movements/week . Loose stools are not present, and criteria for irritable bowel syndrome are insufficient  Common Causes of Constipation . Lack of fiber in your diet . Lack of physical activity . Medications, including iron and calcium supplements  . Dairy intake . Dehydration . Abuse of laxatives  Travel  Irritable Bowel Syndrome  Pregnancy  Luteal phase of menstruation (after ovulation and before menses)  Colorectal problems  Intestinal Dysfunction  Treating Constipation  There are several ways of treating constipation, including changes to diet and exercise, use of laxatives, adjustments to the pelvic floor, and scheduled toileting.  These treatments include: . increasing fiber and fluids in the diet  .  increasing physical activity . learning muscle coordination   learning proper toileting techniques and toileting modifications   designing and sticking  to a toileting schedule     2007, [employer] Doc.22

## 2019-11-27 NOTE — Addendum Note (Signed)
Addended by: Terence Lux A on: 11/27/2019 09:33 AM   Modules accepted: Orders

## 2019-11-27 NOTE — Addendum Note (Signed)
Addended by: Dorothy Spark on: 11/27/2019 09:20 AM   Modules accepted: Orders

## 2019-11-27 NOTE — Progress Notes (Signed)
GYNECOLOGY  VISIT   HPI: 24 y.o.   Single White or Caucasian Not Hispanic or Latino  female   G0P0000 with Patient's last menstrual period was 10/12/2019.   here for misses menses. Stopped OCP's in 4/21, menses 09/12/19 and then again on 10/12/19.  She states that she had lower back pain a bloating. She states that she has not been able to have normal bowel movements and she is having trouble passing gas as well. She says that she took Dustin on Saturday and that seemed to help some  She normally has 2 BM's a day. Friday she had diarrhea, had a normal BM on Saturday am. Feels backed up since then. She had a hard BM this am (pellets). Friday she was having terrible back pain and was very bloated. Saturday she got gas ex, helped some. Still feels bloated and backed up.    No abdominal pain. No fatigue, hair loss. Has some dry skin.  Sexually active, using condoms. No dyspareunia. Same partner x 6 years. No STD concerns.   No galactorrhea, nipples very sore x 5 days. Lower back pain since last week, mainly when moving.    She was seen at Urgent Care on 11/24/19. Reviewed labs: negative UPT, negative abdominal X-Ray. She also reports negative vaginal ultrasound (not in care everywhere).   GYNECOLOGIC HISTORY: Patient's last menstrual period was 10/12/2019. Contraception: condoms Menopausal hormone therapy: none        OB History    Gravida  0   Para  0   Term  0   Preterm  0   AB  0   Living  0     SAB  0   TAB  0   Ectopic  0   Multiple  0   Live Births                 There are no problems to display for this patient.   Past Medical History:  Diagnosis Date  . Anxiety   . Depression     Past Surgical History:  Procedure Laterality Date  . Anxiety    . WISDOM TOOTH EXTRACTION Bilateral 2016    No current outpatient medications on file.   No current facility-administered medications for this visit.     ALLERGIES: Patient has no known allergies.  Family  History  Problem Relation Age of Onset  . Diabetes Maternal Grandmother   . Thyroid disease Paternal Grandmother   . Diabetes Paternal Grandfather   . Stroke Paternal Grandfather     Social History   Socioeconomic History  . Marital status: Single    Spouse name: Not on file  . Number of children: Not on file  . Years of education: Not on file  . Highest education level: Not on file  Occupational History  . Not on file  Tobacco Use  . Smoking status: Never Smoker  . Smokeless tobacco: Never Used  Substance and Sexual Activity  . Alcohol use: Yes    Alcohol/week: 0.0 - 1.0 standard drinks  . Drug use: No  . Sexual activity: Yes    Partners: Male    Birth control/protection: OCP  Other Topics Concern  . Not on file  Social History Narrative  . Not on file   Social Determinants of Health   Financial Resource Strain:   . Difficulty of Paying Living Expenses:   Food Insecurity:   . Worried About Charity fundraiser in the Last Year:   .  Ran Out of Food in the Last Year:   Transportation Needs:   . Film/video editor (Medical):   [name] Lack of Transportation (Non-Medical):   Physical Activity:   . Days of Exercise per Week:   . Minutes of Exercise per Session:   Stress:   . Feeling of Stress :   Social Connections:   . Frequency of Communication with Friends and Family:   . Frequency of Social Gatherings with Friends and Family:   . Attends Religious Services:   . Active Member of Clubs or Organizations:   . Attends Archivist Meetings:   [name] Marital Status:   Intimate Partner Violence:   . Fear of Current or Ex-Partner:   . Emotionally Abused:   [name] Physically Abused:   . Sexually Abused:     Review of Systems  Gastrointestinal: Positive for abdominal pain and constipation.  Musculoskeletal: Positive for back pain.  All other systems reviewed and are negative.   PHYSICAL EXAMINATION:    BP 100/68   Pulse 79   Temp 97.9 F (36.6 C)   Ht 5\' 4"   (1.626 m)   Wt 156 lb (70.8 kg)   LMP 10/12/2019   SpO2 99%   BMI 26.78 kg/m     General appearance: alert, cooperative and appears stated age Neck: no adenopathy, supple, symmetrical, trachea midline and thyroid normal to inspection and palpation Abdomen: soft, mildly tender in the lower abdomen bilaterally. Moderately distended,  no masses,  no organomegaly  Pelvic: External genitalia:  no lesions              Urethra:  normal appearing urethra with no masses, tenderness or lesions              Bartholins and Skenes: normal                 Vagina: normal appearing vagina with normal color and discharge, no lesions              Cervix: no cervical motion tenderness and no lesions              Bimanual Exam:  Uterus:  normal size, contour, position, consistency, mobility, non-tender and anteverted              Adnexa: no mass, fullness, tenderness                Chaperone was present for exam.  ASSESSMENT Missed menses, nipple tenderness. Negative UPT on 11/24/19 Constipation. Negative abdominal x-ray on 6/18 Lower back pain     PLAN Check BhcG, TSH, prolactin Discussed constipation, recommended she start miralax Information on back pain given, Ibuprofen called in.    An After Visit Summary was printed and given to the patient.  23 minutes spent in total patient care.

## 2019-11-28 ENCOUNTER — Telehealth: Payer: Self-pay

## 2019-11-28 LAB — TSH: TSH: 1.6 mIU/L

## 2019-11-28 LAB — PROLACTIN: Prolactin: 12.5 ng/mL

## 2019-11-28 LAB — HCG, QUANTITATIVE, PREGNANCY: HCG, Total, QN: <3 m[IU]/mL

## 2019-11-28 MED ORDER — MEDROXYPROGESTERONE ACETATE 5 MG PO TABS
5.0000 mg | ORAL_TABLET | Freq: Every day | ORAL | 0 refills | Status: DC
Start: 2019-12-12 — End: 2020-01-01

## 2019-11-28 NOTE — Addendum Note (Signed)
Addended by: Georgia Lopes on: 11/28/2019 02:35 PM   Modules accepted: Orders

## 2019-11-28 NOTE — Telephone Encounter (Signed)
Rx sent to pharmacy on file  Provera Rx #5, 0RF  Pt verbalized understanding to not use until 7/6 if no cycle.

## 2019-11-28 NOTE — Telephone Encounter (Signed)
Spoke with pt. Pt calling to go over results. Pt given results and recommendations per Dr Talbert Nan. Pt agreeable.  Pt to monitor cycles and use condoms. Pt to start taking Provera on 7/6 if no cycle as directed by Dr Talbert Nan. Pt verbalized understanding. Pt to return call to with no cycle or cycle. Pt verbalized understanding.   Salvadore Dom, MD  [facility] Triage Pool Please let the patient know that her BhcG was negative and her other lab work is normal. She should continue to use condoms.  If she gets to 12/12/19 without a cycle, then I would recommend she take provera 5 mg x 5 days. Her lining should be building up and this should bring on a cycle. She should call us with or without a cycle (should bleed within 1-2 weeks of finishing the provera)   Encounter closed.

## 2019-11-28 NOTE — Telephone Encounter (Signed)
Patient is calling to go over results.  

## 2019-12-29 ENCOUNTER — Encounter: Payer: Self-pay | Admitting: Obstetrics and Gynecology

## 2019-12-29 ENCOUNTER — Telehealth: Payer: Self-pay | Admitting: Obstetrics and Gynecology

## 2019-12-29 NOTE — Telephone Encounter (Signed)
Sonya Edwards  [facility] Good afternoon! Hoping you might be able to help me. In the last 3-4 days my vulva has been a little itchy, burns a tiny bit, and Ive had to urinate much more frequently than usual, but normal amounts coming out each time. No odor, no abnormal discharge. I use monistat 1 last night, and now my vulva is burning terribly when I urinate and for about 5 minutes after wiping. I am unable to take off of work to come in within the next week or so due to having to leave for several appointments last month. Is there anything you could prescribe, or recommend OTC???

## 2019-12-29 NOTE — Telephone Encounter (Signed)
Spoke with patient. Denies dysuira, flank pain, fever/chills, hematuria. Advised patient OV needed for further evaluation, patient requesting Monday morning appt with any provider. OV scheduled for 01/01/20 at 8:30am with Dr. Sabra Heck. Advised may try coconut oil externally for symptom relief. Patient verbalizes understanding and is agreeable.   Routing to provider for final review. Patient is agreeable to disposition. Will close encounter.

## 2020-01-01 ENCOUNTER — Other Ambulatory Visit: Payer: Self-pay

## 2020-01-01 ENCOUNTER — Encounter: Payer: Self-pay | Admitting: Obstetrics & Gynecology

## 2020-01-01 ENCOUNTER — Ambulatory Visit (INDEPENDENT_AMBULATORY_CARE_PROVIDER_SITE_OTHER): Payer: BC Managed Care – PPO | Admitting: Obstetrics & Gynecology

## 2020-01-01 VITALS — BP 106/64 | HR 68 | Resp 16 | Wt 157.0 lb

## 2020-01-01 DIAGNOSIS — N898 Other specified noninflammatory disorders of vagina: Secondary | ICD-10-CM

## 2020-01-01 DIAGNOSIS — R350 Frequency of micturition: Secondary | ICD-10-CM

## 2020-01-01 LAB — POCT URINALYSIS DIPSTICK
Bilirubin, UA: NEGATIVE
Blood, UA: NEGATIVE
Glucose, UA: NEGATIVE
Ketones, UA: NEGATIVE
Leukocytes, UA: NEGATIVE
Nitrite, UA: NEGATIVE
Protein, UA: NEGATIVE
Urobilinogen, UA: NEGATIVE E.U./dL — AB
pH, UA: 5 (ref 5.0–8.0)

## 2020-01-01 NOTE — Progress Notes (Signed)
GYNECOLOGY  VISIT  CC:   Vaginal itching, urinary burning  HPI: 24 y.o. G0P0000 Single White or Caucasian female here for vaginal itching that started on Tuesday of last week.  She used a Monistat one day on Thursday but on Friday she has a lot of skin irritation and actual burning & urinary frequency.  States she actually felt worse for about a day.  She used a baking soda bath and this helped for a short time.  Denies discharge.  she is still having itching and is actually feeling some dryness.  She is SA.  Had GC/Chl testing 04/2019.   Uses condoms for contraception.   poct urine-neg.  GYNECOLOGIC HISTORY: Patient's last menstrual period was 12/04/2019 (exact date). Contraception: condoms occ Menopausal hormone therapy: none  There are no problems to display for this patient.   Past Medical History:  Diagnosis Date  . Anxiety   . Depression     Past Surgical History:  Procedure Laterality Date  . Anxiety    . WISDOM TOOTH EXTRACTION Bilateral 2016    MEDS:   Current Outpatient Medications on File Prior to Visit  Medication Sig Dispense Refill  . ibuprofen (ADVIL) 800 MG tablet Take 1 tablet (800 mg total) by mouth every 8 (eight) hours as needed. 30 tablet 1   No current facility-administered medications on file prior to visit.    ALLERGIES: Patient has no known allergies.  Family History  Problem Relation Age of Onset  . Diabetes Maternal Grandmother   . Thyroid disease Paternal Grandmother   . Diabetes Paternal Grandfather   . Stroke Paternal Grandfather     SH:  Single, non smoker  Review of Systems  Constitutional: Negative.   HENT: Negative.   Eyes: Negative.   Respiratory: Negative.   Cardiovascular: Negative.   Gastrointestinal: Negative.   Endocrine: Negative.   Genitourinary: Positive for frequency.       Vaginal itching & burning  Musculoskeletal: Negative.   Skin: Negative.   Allergic/Immunologic: Negative.   Neurological: Negative.    Hematological: Negative.   Psychiatric/Behavioral: Negative.     PHYSICAL EXAMINATION:    BP (!) 106/64   Pulse 68   Resp 16   Wt 157 lb (71.2 kg)   LMP 12/04/2019 (Exact Date)   BMI 26.95 kg/m     General appearance: alert, cooperative and appears stated age Flank:  No CVA tenderness Abdomen: soft, non-tender; bowel sounds normal; no masses,  no organomegaly Lymph:  no inguinal LAD noted  Pelvic: External genitalia:  no lesions              Urethra:  Erythematous vaginal mucosa noted, whitish discharge noted, no visible lesions                 Vagina: normal appearing vagina with normal color and discharge, no lesions              Cervix: no lesions              Bimanual Exam:  Uterus:  normal size, contour, position, consistency, mobility, non-tender              Adnexa: no mass, fullness, tenderness  Chaperone, Shannon , CMA, was present for exam.  Assessment: Vaginal discharge Vaginal itching and dryness that has been present almost a week Urinary urgency and dysuria that has improved with neg POCT urine testing today  Plan: Affirm swab obtained today.  Pt desires to wait until results are back for  treatment.

## 2020-01-02 ENCOUNTER — Encounter: Payer: Self-pay | Admitting: Obstetrics & Gynecology

## 2020-01-03 ENCOUNTER — Telehealth: Payer: Self-pay | Admitting: Obstetrics & Gynecology

## 2020-01-03 NOTE — Telephone Encounter (Signed)
Labs are still pending. Will defer to Dr Sabra Heck tomorrow

## 2020-01-03 NOTE — Telephone Encounter (Signed)
Labs pending.   MyChart message to patient.

## 2020-01-03 NOTE — Telephone Encounter (Signed)
Meriel, Kelli[place] to Me     01/03/20 2:55 PM Okay. Last time only took about a day and I was told that the results would have been in yesterday morning. Im experiencing more discomfort than I was Monday at my appointment. Can we go ahead and have something called into my pharmacy this evening? I dont think waiting 5 days is a good idea with suspected BV.... not understanding why the results are taking so long.[name] are we sure the sample was picked up?  Reyne, [place]i to Me    01/03/20 2:57 PM Again Im just very concerned since it hasnt taken this long before... I came in at 8:30 Monday morning... tomorrow is Thursday... if its BV its just continuing to get worse.   MyChart message to patient.   Routing to covering provider Dr. Talbert Nan -patient is [place]. Results still pending.   Cc: Dr. Sabra Heck

## 2020-01-03 NOTE — Telephone Encounter (Signed)
Sonya Edwards  P [facility] Still no results? Getting kinda concerned since it's been over 2 days.[name]

## 2020-01-03 NOTE — Telephone Encounter (Signed)
    Patient calling for lab results 

## 2020-01-04 MED ORDER — FLUCONAZOLE 150 MG PO TABS
150.0000 mg | ORAL_TABLET | Freq: Once | ORAL | 0 refills | Status: AC
Start: 2020-01-04 — End: 2020-01-04

## 2020-01-04 MED ORDER — METRONIDAZOLE 0.75 % VA GEL
1.0000 | Freq: Two times a day (BID) | VAGINAL | 0 refills | Status: AC
Start: 2020-01-04 — End: 2020-01-09

## 2020-01-04 NOTE — Telephone Encounter (Signed)
Can you please call pt and let her know we've contact [facility] to figure out why this is taking so long.  This is not typically how long these tests take but she needed it to go to [facility] and I send very little there.  They did pick it up the same day of the test.  I think ok to treat with metrogel 0.75 % nightly x 5 nights and diflucan 150mg  po x 1, repeat in 72 hours if still symptomatic.  This will treat BV and yeast and we can see what the test result says when it is finalized.  Thanks.

## 2020-01-04 NOTE — Telephone Encounter (Signed)
Spoke with Sonya Edwards. Sonya Edwards given recommendations per Dr Sabra Heck. Sonya Edwards states is feeling better today than Monday when she was seen. [facility] labs have not resulted. Verline Lema, RN has called [facility] for update. Will call Sonya Edwards once results are final. Sonya Edwards agreeable.   Sonya Edwards ok to have Rx sent to pharmacy, but will wait for final results before taking. Sonya Edwards advised ok to do.

## 2020-01-04 NOTE — Telephone Encounter (Signed)
Spoke with [facility]. Results to be faxed to our office.

## 2020-01-05 ENCOUNTER — Encounter: Payer: Self-pay | Admitting: Obstetrics & Gynecology

## 2020-01-05 LAB — SURESWAB BACTERIAL VAGINOSIS/ITIS
Atopobium vaginae: NOT DETECTED Log (cells/mL)
C. albicans, DNA: NOT DETECTED
C. glabrata, DNA: NOT DETECTED
C. parapsilosis, DNA: NOT DETECTED
C. tropicalis, DNA: NOT DETECTED
Gardnerella vaginalis: <4.7 Log (cells/mL)
LACTOBACILLUS SPECIES: >8 Log (cells/mL)
MEGASPHAERA SPECIES: NOT DETECTED Log (cells/mL)
Trichomonas vaginalis RNA: NOT DETECTED

## 2020-01-05 NOTE — Telephone Encounter (Signed)
Call to patient to provide update. Advised patient results still pending. Patient reports no new symptoms, feels like symptoms may be "increased" a little today. Advised patient office nursing supervisor is going to check for results again later today, our office will f/u once results have been completed and reviewed by provider. Patient will plan to pick up metrogel and diflucan this afternoon to start if no results by this evening. Advised patient I will update Dr. Sabra Heck and return call if any additional recommendations. Patient thankful for f/u call.   Routing to Dr. Sabra Heck  Cc: Reesa Chew, RN

## 2020-01-05 NOTE — Telephone Encounter (Signed)
Sonya Edwards [facility] Clinical Pool Someone called yesterday saying they were going to reach out to [facility]. Have you all still not gotten an answer from them? I really need to know that's going on with this since tomorrow the office will be closed... I understand that a prescription has been called in but very frustrated that no one knows what's going on at [facility]?

## 2020-01-05 NOTE — Telephone Encounter (Signed)
Call to [place] with [facility] who states that the patient's test results from 01/01/2020 are still pending at this time.

## 2020-02-19 ENCOUNTER — Other Ambulatory Visit: Payer: Self-pay

## 2020-02-19 ENCOUNTER — Ambulatory Visit (INDEPENDENT_AMBULATORY_CARE_PROVIDER_SITE_OTHER): Payer: BC Managed Care – PPO

## 2020-02-19 ENCOUNTER — Ambulatory Visit (INDEPENDENT_AMBULATORY_CARE_PROVIDER_SITE_OTHER): Payer: BC Managed Care – PPO | Admitting: Registered Nurse

## 2020-02-19 ENCOUNTER — Encounter: Payer: Self-pay | Admitting: Registered Nurse

## 2020-02-19 DIAGNOSIS — M542 Cervicalgia: Secondary | ICD-10-CM | POA: Diagnosis not present

## 2020-02-19 MED ORDER — PREDNISONE 10 MG (21) PO TBPK: ORAL_TABLET | ORAL | 0 refills | Status: DC

## 2020-02-19 MED ORDER — METHOCARBAMOL 500 MG PO TABS: 500.0000 mg | ORAL_TABLET | Freq: Four times a day (QID) | ORAL | 0 refills | Status: DC

## 2020-02-19 NOTE — Patient Instructions (Signed)
° ° ° °  If you have lab work done today you will be contacted with your lab results within the next 2 weeks.  If you have not heard from us then please contact us. The fastest way to get your results is to register for My Chart. ° ° °IF you received an x-ray today, you will receive an invoice from [facility]. Please contact [facility] at [phone] with questions or concerns regarding your invoice.  ° °IF you received labwork today, you will receive an invoice from [facility]. Please contact [facility] at [phone] with questions or concerns regarding your invoice.  ° °Our billing staff will not be able to assist you with questions regarding bills from these companies. ° °You will be contacted with the lab results as soon as they are available. The fastest way to get your results is to activate your My Chart account. Instructions are located on the last page of this paperwork. If you have not heard from us regarding the results in 2 weeks, please contact this office. °  ° ° ° °

## 2020-04-16 ENCOUNTER — Encounter: Payer: Self-pay | Admitting: Obstetrics and Gynecology

## 2020-04-17 ENCOUNTER — Telehealth: Payer: Self-pay

## 2020-04-17 NOTE — Telephone Encounter (Signed)
Spoke with pt. Pt given recommendations per Dr Talbert Nan. Pt agreeable to come for OV. Pt scheduled for 11/12 at 8 am with Dr Talbert Nan. Pt agreeable to date and time of appt. Encounter closed

## 2020-04-17 NOTE — Telephone Encounter (Signed)
Patient needs an appointment

## 2020-04-17 NOTE — Telephone Encounter (Signed)
Sonya Edwards, Sonya Edwards Clinical Pool My last period start date was 02/16/20. I have not had a period since then. I have taken 3 at home pregnancy tests that have been negative. What do you guys recommend? Wait it out or come in? I'm not on birth control right now.

## 2020-04-17 NOTE — Telephone Encounter (Signed)
I don't think she needs a pelvic exam, but I do thinks she needs to come in for an office visit and further lab work. I wouldn't wait until December.

## 2020-04-17 NOTE — Telephone Encounter (Signed)
Patient is calling in regards to office visit appointment that is scheduled. Patient wants a sooner appointment, patient denied sooner appointment with Dr. Talbert Nan. Routing to triage for scheduling.

## 2020-04-17 NOTE — Telephone Encounter (Signed)
AEX 04/2019 with DL  Last OV 11/2019 with BS for missed menses H/o missed menses Neg Beta Hcg 11/2019  Spoke with pt. Pt states having cycle in Sept ( 02/16/20) as regular, normal cycle. Now missed cycle for 2 months. Pt states off OCPs x 4-5 months to give her body break. States has been taking OCPs since age 24. Pt states taking UPT x last 3 weeks and all negative. Denies any pregnancy symptoms, cramps, NVD, fever, chills.  Pt advised can continue to monitor rest of month and calendar cycle. Pt agreeable. States would like to make OV just in case doesn't have cycle. Pt scheduled for OV on 12/1 at 430 pm with Dr Talbert Nan. Pt verbalized understanding to date and time of appt.  Routing to Dr Talbert Nan for review and update Encounter closed

## 2020-04-17 NOTE — Telephone Encounter (Signed)
Pt calling back after speaking to her this morning. See previous phone encounter for details.  Spoke with pt. Pt calling back to decline OV made due to not wanting pelvic exam. Pt requesting if can come just for Beta Hcg for missed cycles instead?   Advised pt will review with Dr Talbert Nan and return call. Pt agreeable.  Routing to Dr Talbert Nan. Please advise

## 2020-04-18 NOTE — Progress Notes (Signed)
GYNECOLOGY  VISIT   HPI: 24 y.o.   Single White or Caucasian Not Hispanic or Latino  female   G0P0000 with Patient's last menstrual period was 02/16/2020.   here for missed menses    The patient went off of OCP's in 4/21, she was seen in 6/21 with c/o missed menses. At that visit she had a normal TSH, normal prolactin and negative BhcG.  Skipped her cycle in June, got one in July (didn't need to take provera). Next cycle started 5 weeks later, then September cycle was 27 days. No cycle since then. LMP 02/16/20. Same long term partner, using condoms intermittently. Not sexually active very often. Hasn't had sex in a month, typically has sex every couple of weeks. Doesn't want to get pregnant. She went off of OCP's, got tired of taking a pill every day.  She has had negative home UPT. She had an episode earlier this week where she got light headed, felt like she was going to pass out. This occurred when she was driving, she felt her heart racing and was trembling after pulling over. Felt anxious after she pulled over.   When she gets her cycle she bleeds x 4-5 days. Saturates a super tampon in up to 4 hours. Tolerable cramps.  No galactorrhea. She c/o hair loss, no other thyroid c/o. No hirsutism or acne.  She has anxiety, no increase in life stressors. Her anxiety is a little worse recently. She has a h/o depression, worse recently. Some days harder than others. Not finding any joy in life. Getting along with her partner. No thoughts SI, HI. She has had a therapist in the past, hasn't helped. Previously tried Zoloft and prozac, didn't really help. Made her angry.   GYNECOLOGIC HISTORY: Patient's last menstrual period was 02/16/2020. Contraception:condoms Menopausal hormone therapy: none        OB History    Gravida  0   Para  0   Term  0   Preterm  0   AB  0   Living  0     SAB  0   TAB  0   Ectopic  0   Multiple  0   Live Births                 There are no  problems to display for this patient.   Past Medical History:  Diagnosis Date  . Anxiety   . Depression     Past Surgical History:  Procedure Laterality Date  . Anxiety    . WISDOM TOOTH EXTRACTION Bilateral 2016    Current Outpatient Medications  Medication Sig Dispense Refill  . ibuprofen (ADVIL) 800 MG tablet Take 1 tablet (800 mg total) by mouth every 8 (eight) hours as needed. 30 tablet 1   No current facility-administered medications for this visit.     ALLERGIES: Patient has no known allergies.  Family History  Problem Relation Age of Onset  . Diabetes Maternal Grandmother   . Thyroid disease Paternal Grandmother   . Diabetes Paternal Grandfather   . Stroke Paternal Grandfather     Social History   Socioeconomic History  . Marital status: Single    Spouse name: Not on file  . Number of children: Not on file  . Years of education: Not on file  . Highest education level: Not on file  Occupational History  . Not on file  Tobacco Use  . Smoking status: Never Smoker  . Smokeless tobacco: Never Used  Vaping Use  . Vaping Use: Never used  Substance and Sexual Activity  . Alcohol use: Yes    Alcohol/week: 0.0 - 1.0 standard drinks  . Drug use: No  . Sexual activity: Yes    Partners: Male    Birth control/protection: None  Other Topics Concern  . Not on file  Social History Narrative  . Not on file   Social Determinants of Health   Financial Resource Strain:   . Difficulty of Paying Living Expenses: Not on file  Food Insecurity:   . Worried About Charity fundraiser in the Last Year: Not on file  . Ran Out of Food in the Last Year: Not on file  Transportation Needs:   . Lack of Transportation (Medical): Not on file  . Lack of Transportation (Non-Medical): Not on file  Physical Activity:   . Days of Exercise per Week: Not on file  . Minutes of Exercise per Session: Not on file  Stress:   . Feeling of Stress : Not on file  Social Connections:   .  Frequency of Communication with Friends and Family: Not on file  . Frequency of Social Gatherings with Friends and Family: Not on file  . Attends Religious Services: Not on file  . Active Member of Clubs or Organizations: Not on file  . Attends Archivist Meetings: Not on file  . Marital Status: Not on file  Intimate Partner Violence:   . Fear of Current or Ex-Partner: Not on file  . Emotionally Abused: Not on file  . Physically Abused: Not on file  . Sexually Abused: Not on file    Review of Systems  Constitutional: Negative.   HENT: Negative.   Eyes: Negative.   Respiratory: Negative.   Cardiovascular: Negative.   Gastrointestinal: Negative.   Genitourinary: Negative.   Musculoskeletal: Negative.   Skin: Negative.   Neurological: Negative.   Endo/Heme/Allergies: Negative.   Psychiatric/Behavioral: Negative.     PHYSICAL EXAMINATION:    BP 110/70 (BP Location: Right Arm, Patient Position: Sitting, Cuff Size: Normal)   Pulse 68   Resp 16   Wt 156 lb (70.8 kg)   LMP 02/16/2020   BMI 26.78 kg/m     General appearance: alert, cooperative and appears stated age Neck: no adenopathy, supple, symmetrical, trachea midline and thyroid normal to inspection and palpation  ASSESSMENT Missed menses, prior normal tsh and prolactin Depression and anxiety Contraception, not wanting to get pregnant. Discussed birth control options.     PLAN UPT negatvie Take provera 5 mg x 5 days now and every other month if no spontaneous cycles Discussed contraception, may go back on OCP's but wants to wait for now.  Use OCP's Will call in [place] for emergency contraception Start Celexa Name of counselor given F/U in one month   ~30 minutes in total patient care.

## 2020-04-19 ENCOUNTER — Encounter: Payer: Self-pay | Admitting: Obstetrics and Gynecology

## 2020-04-19 ENCOUNTER — Ambulatory Visit (INDEPENDENT_AMBULATORY_CARE_PROVIDER_SITE_OTHER): Payer: BC Managed Care – PPO | Admitting: Obstetrics and Gynecology

## 2020-04-19 ENCOUNTER — Other Ambulatory Visit: Payer: Self-pay

## 2020-04-19 VITALS — BP 110/70 | HR 68 | Resp 16 | Wt 156.0 lb

## 2020-04-19 DIAGNOSIS — R42 Dizziness and giddiness: Secondary | ICD-10-CM

## 2020-04-19 DIAGNOSIS — N926 Irregular menstruation, unspecified: Secondary | ICD-10-CM

## 2020-04-19 DIAGNOSIS — L659 Nonscarring hair loss, unspecified: Secondary | ICD-10-CM | POA: Diagnosis not present

## 2020-04-19 DIAGNOSIS — F418 Other specified anxiety disorders: Secondary | ICD-10-CM

## 2020-04-19 DIAGNOSIS — Z3009 Encounter for other general counseling and advice on contraception: Secondary | ICD-10-CM

## 2020-04-19 LAB — CBC
HCT: 41.1 % (ref 35.0–45.0)
Hemoglobin: 14.2 g/dL (ref 11.7–15.5)
MCH: 32.6 pg (ref 27.0–33.0)
MCHC: 34.5 g/dL (ref 32.0–36.0)
MCV: 94.3 fL (ref 80.0–100.0)
MPV: 9.5 fL (ref 7.5–12.5)
Platelets: 249 10*3/uL (ref 140–400)
RBC: 4.36 Million/uL (ref 3.80–5.10)
RDW: 12.6 % (ref 11.0–15.0)
WBC: 4.6 10*3/uL (ref 3.8–10.8)

## 2020-04-19 LAB — TSH: TSH: 0.9 mIU/L

## 2020-04-19 LAB — GLUCOSE, RANDOM: Glucose, Plasma: 97 mg/dL (ref 65–139)

## 2020-04-19 LAB — POCT URINE PREGNANCY: Preg Test, Ur: NEGATIVE

## 2020-04-19 MED ORDER — CITALOPRAM HYDROBROMIDE 20 MG PO TABS: ORAL_TABLET | ORAL | 1 refills | Status: DC

## 2020-04-19 MED ORDER — ELLA 30 MG PO TABS: 1.0000 | ORAL_TABLET | Freq: Once | ORAL | 0 refills | Status: DC

## 2020-04-19 NOTE — Addendum Note (Signed)
Addended by: Terence Lux A on: 04/19/2020 08:45 AM   Modules accepted: Orders

## 2020-04-19 NOTE — Patient Instructions (Addendum)
Counselor: Rogene Houston [phone]   Managing Anxiety, Adult After being diagnosed with an anxiety disorder, you may be relieved to know why you have felt or behaved a certain way. You may also feel overwhelmed about the treatment ahead and what it will mean for your life. With care and support, you can manage this condition and recover from it. How to manage lifestyle changes Managing stress and anxiety  Stress is your body's reaction to life changes and events, both good and bad. Most stress will last just a few hours, but stress can be ongoing and can lead to more than just stress. Although stress can play a major role in anxiety, it is not the same as anxiety. Stress is usually caused by something external, such as a deadline, test, or competition. Stress normally passes after the triggering event has ended.  Anxiety is caused by something internal, such as imagining a terrible outcome or worrying that something will go wrong that will devastate you. Anxiety often does not go away even after the triggering event is over, and it can become long-term (chronic) worry. It is important to understand the differences between stress and anxiety and to manage your stress effectively so that it does not lead to an anxious response. Talk with your health care provider or a counselor to learn more about reducing anxiety and stress. He or she may suggest tension reduction techniques, such as:  Music therapy. This can include creating or listening to music that you enjoy and that inspires you.  Mindfulness-based meditation. This involves being aware of your normal breaths while not trying to control your breathing. It can be done while sitting or walking.  Centering prayer. This involves focusing on a word, phrase, or sacred image that means something to you and brings you peace.  Deep breathing. To do this, expand your stomach and inhale slowly through your nose. Hold your breath for 3-5 seconds. Then  exhale slowly, letting your stomach muscles relax.  Self-talk. This involves identifying thought patterns that lead to anxiety reactions and changing those patterns.  Muscle relaxation. This involves tensing muscles and then relaxing them. Choose a tension reduction technique that suits your lifestyle and personality. These techniques take time and practice. Set aside 5-15 minutes a day to do them. Therapists can offer counseling and training in these techniques. The training to help with anxiety may be covered by some insurance plans. Other things you can do to manage stress and anxiety include:  Keeping a stress/anxiety diary. This can help you learn what triggers your reaction and then learn ways to manage your response.  Thinking about how you react to certain situations. You may not be able to control everything, but you can control your response.  Making time for activities that help you relax and not feeling guilty about spending your time in this way.  Visual imagery and yoga can help you stay calm and relax.  Medicines Medicines can help ease symptoms. Medicines for anxiety include:  Anti-anxiety drugs.  Antidepressants. Medicines are often used as a primary treatment for anxiety disorder. Medicines will be prescribed by a health care provider. When used together, medicines, psychotherapy, and tension reduction techniques may be the most effective treatment. Relationships Relationships can play a big part in helping you recover. Try to spend more time connecting with trusted friends and family members. Consider going to couples counseling, taking family education classes, or going to family therapy. Therapy can help you and others better understand your condition.  How to recognize changes in your anxiety Everyone responds differently to treatment for anxiety. Recovery from anxiety happens when symptoms decrease and stop interfering with your daily activities at home or work. This  may mean that you will start to:  Have better concentration and focus. Worry will interfere less in your daily thinking.  Sleep better.  Be less irritable.  Have more energy.  Have improved memory. It is important to recognize when your condition is getting worse. Contact your health care provider if your symptoms interfere with home or work and you feel like your condition is not improving. Follow these instructions at home: Activity  Exercise. Most adults should do the following: ? Exercise for at least 150 minutes each week. The exercise should increase your heart rate and make you sweat (moderate-intensity exercise). ? Strengthening exercises at least twice a week.  Get the right amount and quality of sleep. Most adults need 7-9 hours of sleep each night. Lifestyle   Eat a healthy diet that includes plenty of vegetables, fruits, whole grains, low-fat dairy products, and lean protein. Do not eat a lot of foods that are high in solid fats, added sugars, or salt.  Make choices that simplify your life.  Do not use any products that contain nicotine or tobacco, such as cigarettes, e-cigarettes, and chewing tobacco. If you need help quitting, ask your health care provider.  Avoid caffeine, alcohol, and certain over-the-counter cold medicines. These may make you feel worse. Ask your pharmacist which medicines to avoid. General instructions  Take over-the-counter and prescription medicines only as told by your health care provider.  Keep all follow-up visits as told by your health care provider. This is important. Where to find support You can get help and support from these sources:  Self-help groups.  Online and [employer].  A trusted spiritual leader.  Couples counseling.  Family education classes.  Family therapy. Where to find more information You may find that joining a support group helps you deal with your anxiety. The following sources can help you  locate counselors or support groups near you:  [place]: www.mentalhealthamerica.net  Anxiety and Depression Association of [place] (ADAA): https://www.clark.net/  National Alliance on Mental Illness (NAMI): www.nami.org Contact a health care provider if you:  Have a hard time staying focused or finishing daily tasks.  Spend many hours a day feeling worried about everyday life.  Become exhausted by worry.  Start to have headaches, feel tense, or have nausea.  Urinate more than normal.  Have diarrhea. Get help right away if you have:  A racing heart and shortness of breath.  Thoughts of hurting yourself or others. If you ever feel like you may hurt yourself or others, or have thoughts about taking your own life, get help right away. You can go to your nearest emergency department or call:  Your local emergency services (911 in the [place]  A suicide crisis helpline, such as the [place] at 919-764-5147. This is open 24 hours a day. Summary  Taking steps to learn and use tension reduction techniques can help calm you and help prevent triggering an anxiety reaction.  When used together, medicines, psychotherapy, and tension reduction techniques may be the most effective treatment.  Family, friends, and partners can play a big part in helping you recover from an anxiety disorder. This information is not intended to replace advice given to you by your health care provider. Make sure you discuss any questions you have with  your health care provider. Document Revised: 10/25/2018 Document Reviewed: 10/25/2018 Elsevier Patient Education  [employer] With Depression Everyone experiences occasional disappointment, sadness, and loss in their lives. When you are feeling down, blue, or sad for at least 2 weeks in a row, it may mean that you have depression. Depression can affect your thoughts and feelings, relationships, daily activities,  and physical health. It is caused by changes in the way your brain functions. If you receive a diagnosis of depression, your health care provider will tell you which type of depression you have and what treatment options are available to you. If you are living with depression, there are ways to help you recover from it and also ways to prevent it from coming back. How to cope with lifestyle changes Coping with stress     Stress is your body's reaction to life changes and events, both good and bad. Stressful situations may include:  Getting married.  The death of a spouse.  Losing a job.  Retiring.  Having a baby. Stress can last just a few hours or it can be ongoing. Stress can play a major role in depression, so it is important to learn both how to cope with stress and how to think about it differently. Talk with your health care provider or a counselor if you would like to learn more about stress reduction. He or she may suggest some stress reduction techniques, such as:  Music therapy. This can include creating music or listening to music. Choose music that you enjoy and that inspires you.  Mindfulness-based meditation. This kind of meditation can be done while sitting or walking. It involves being aware of your normal breaths, rather than trying to control your breathing.  Centering prayer. This is a kind of meditation that involves focusing on a spiritual word or phrase. Choose a word, phrase, or sacred image that is meaningful to you and that brings you peace.  Deep breathing. To do this, expand your stomach and inhale slowly through your nose. Hold your breath for 3-5 seconds, then exhale slowly, allowing your stomach muscles to relax.  Muscle relaxation. This involves intentionally tensing muscles then relaxing them. Choose a stress reduction technique that fits your lifestyle and personality. Stress reduction techniques take time and practice to develop. Set aside 5-15 minutes  a day to do them. Therapists can offer training in these techniques. The training may be covered by some insurance plans. Other things you can do to manage stress include:  Keeping a stress diary. This can help you learn what triggers your stress and ways to control your response.  Understanding what your limits are and saying no to requests or events that lead to a schedule that is too full.  Thinking about how you respond to certain situations. You may not be able to control everything, but you can control how you react.  Adding humor to your life by watching funny films or TV shows.  Making time for activities that help you relax and not feeling guilty about spending your time this way.  Medicines Your health care provider may suggest certain medicines if he or she feels that they will help improve your condition. Avoid using alcohol and other substances that may prevent your medicines from working properly (may interact). It is also important to:  Talk with your pharmacist or health care provider about all the medicines that you take, their possible side effects, and what medicines are safe to take together.  Make it your goal to take part in all treatment decisions (shared decision-making). This includes giving input on the side effects of medicines. It is best if shared decision-making with your health care provider is part of your total treatment plan. If your health care provider prescribes a medicine, you may not notice the full benefits of it for 4-8 weeks. Most people who are treated for depression need to be on medicine for at least 6-12 months after they feel better. If you are taking medicines as part of your treatment, do not stop taking medicines without first talking to your health care provider. You may need to have the medicine slowly decreased (tapered) over time to decrease the risk of harmful side effects. Relationships Your health care provider may suggest family therapy  along with individual therapy and drug therapy. While there may not be family problems that are causing you to feel depressed, it is still important to make sure your family learns as much as they can about your mental health. Having your family's support can help make your treatment successful. How to recognize changes in your condition Everyone has a different response to treatment for depression. Recovery from major depression happens when you have not had signs of major depression for two months. This may mean that you will start to:  Have more interest in doing activities.  Feel less hopeless than you did 2 months ago.  Have more energy.  Overeat less often, or have better or improving appetite.  Have better concentration. Your health care provider will work with you to decide the next steps in your recovery. It is also important to recognize when your condition is getting worse. Watch for these signs:  Having fatigue or low energy.  Eating too much or too little.  Sleeping too much or too little.  Feeling restless, agitated, or hopeless.  Having trouble concentrating or making decisions.  Having unexplained physical complaints.  Feeling irritable, angry, or aggressive. Get help as soon as you or your family members notice these symptoms coming back. How to get support and help from others How to talk with friends and family members about your condition  Talking to friends and family members about your condition can provide you with one way to get support and guidance. Reach out to trusted friends or family members, explain your symptoms to them, and let them know that you are working with a health care provider to treat your depression. Financial resources Not all insurance plans cover mental health care, so it is important to check with your insurance carrier. If paying for co-pays or counseling services is a problem, search for a local or county mental health care center.  They may be able to offer public mental health care services at low or no cost when you are not able to see a private health care provider. If you are taking medicine for depression, you may be able to get the generic form, which may be less expensive. Some makers of prescription medicines also offer help to patients who cannot afford the medicines they need. Follow these instructions at home:   Get the right amount and quality of sleep.  Cut down on using caffeine, tobacco, alcohol, and other potentially harmful substances.  Try to exercise, such as walking or lifting small weights.  Take over-the-counter and prescription medicines only as told by your health care provider.  Eat a healthy diet that includes plenty of vegetables, fruits, whole grains, low-fat dairy products, and lean  protein. Do not eat a lot of foods that are high in solid fats, added sugars, or salt.  Keep all follow-up visits as told by your health care provider. This is important. Contact a health care provider if:  You stop taking your antidepressant medicines, and you have any of these symptoms: ? Nausea. ? Headache. ? Feeling lightheaded. ? Chills and body aches. ? Not being able to sleep (insomnia).  You or your friends and family think your depression is getting worse. Get help right away if:  You have thoughts of hurting yourself or others. If you ever feel like you may hurt yourself or others, or have thoughts about taking your own life, get help right away. You can go to your nearest emergency department or call:  Your local emergency services (911 in the [place]  A suicide crisis helpline, such as the [place] at 718-680-1234. This is open 24-hours a day. Summary  If you are living with depression, there are ways to help you recover from it and also ways to prevent it from coming back.  Work with your health care team to create a management plan that includes counseling,  stress management techniques, and healthy lifestyle habits. This information is not intended to replace advice given to you by your health care provider. Make sure you discuss any questions you have with your health care provider. Document Revised: 09/16/2018 Document Reviewed: 04/27/2016 Elsevier Patient Education  [place].

## 2020-04-22 ENCOUNTER — Other Ambulatory Visit: Payer: Self-pay

## 2020-04-22 NOTE — Telephone Encounter (Signed)
Patient is calling for refill of Provera. Patient states Dr.Jertson "was to write a new prescription at last visit".

## 2020-04-23 ENCOUNTER — Telehealth: Payer: Self-pay

## 2020-04-23 NOTE — Telephone Encounter (Signed)
Appointment Request From: Beverely Pace    With Provider: Salvadore Dom, MD [facility]]    Preferred Date Range: 05/21/2020 - 05/22/2020    Preferred Times: Tuesday Morning, Wednesday Afternoon    Reason for visit: Office Visit    Comments:  Follow up from 11/12

## 2020-04-23 NOTE — Telephone Encounter (Signed)
Call to patient. Patient scheduled for one month follow up on 05-29-20 at 1600. Patient agreeable to date and time of appointment.   Encounter closed.

## 2020-04-24 NOTE — Telephone Encounter (Signed)
Medication refill request: Provera Last OV:  04/19/20 Dr. Talbert Nan Next AEX: 05/29/20 Last MMG (if hormonal medication request): n/a Refill authorized: Today, please advise

## 2020-04-25 MED ORDER — MEDROXYPROGESTERONE ACETATE 5 MG PO TABS: ORAL_TABLET | ORAL | 1 refills | Status: DC

## 2020-04-25 NOTE — Telephone Encounter (Signed)
Please apologize to the patient for me. The script for provera has been sent.

## 2020-04-25 NOTE — Telephone Encounter (Signed)
Spoke with pt. Pt given update on Rx per Dr Talbert Nan. Pt agreeable and verbalized understanding.  Encounter closed

## 2020-05-05 ENCOUNTER — Encounter: Payer: Self-pay | Admitting: Registered Nurse
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
## 2020-05-08 ENCOUNTER — Ambulatory Visit: Payer: BC Managed Care – PPO | Admitting: Obstetrics and Gynecology

## 2020-05-29 ENCOUNTER — Telehealth: Payer: Self-pay

## 2020-05-29 ENCOUNTER — Ambulatory Visit: Payer: BC Managed Care – PPO | Admitting: Obstetrics and Gynecology

## 2020-05-29 NOTE — Telephone Encounter (Signed)
Left detailed message for pt. OK per DPR. Pt advised to return call to office to schedule OV with Dr Talbert Nan as long as no sx of Covid or positive test.  Encounter closed

## 2020-05-29 NOTE — Telephone Encounter (Signed)
Patient cancelled follow up appointment for today due to "exposure to covid". Patient states she will call back to reschedule if she feels she needs to keep appointment.

## 2020-07-05 ENCOUNTER — Ambulatory Visit: Payer: BC Managed Care – PPO | Admitting: Podiatry

## 2020-07-05 ENCOUNTER — Other Ambulatory Visit: Payer: Self-pay

## 2020-07-05 DIAGNOSIS — Q828 Other specified congenital malformations of skin: Secondary | ICD-10-CM

## 2020-07-05 DIAGNOSIS — M7751 Other enthesopathy of right foot: Secondary | ICD-10-CM

## 2020-07-08 NOTE — Progress Notes (Signed)
  Subjective:  Patient ID: Sonya Edwards, female    DOB: [birth year],  MRN: [id number]  Chief Complaint  Patient presents with  . Foot Pain    Right plantar painful lesion 1 1/2 month duration    25 y.o. female presents with the above complaint. History confirmed with patient.  States the lesion is very painful especially with shoes.  Objective:  Physical Exam: warm, good capillary refill, no trophic changes or ulcerative lesions, normal DP and PT pulses and normal sensory exam. Right Foot: Punctate keratosis noted to nonweightbearing area of the right fifth metatarsal   Assessment:   1. Capsulitis of metatarsophalangeal (MTP) joint of right foot   2. Porokeratosis      Plan:  Patient was evaluated and treated and all questions answered.  Porokeratosis -Educated on etiology.   -Lesion debrided and destroyed with Salinocaine -Follow-up should issues persist  No follow-ups on file.

## 2020-07-16 ENCOUNTER — Encounter: Payer: Self-pay | Admitting: Podiatry

## 2020-07-30 ENCOUNTER — Ambulatory Visit: Payer: BC Managed Care – PPO | Admitting: Podiatry

## 2020-08-06 ENCOUNTER — Ambulatory Visit: Payer: BC Managed Care – PPO | Admitting: Podiatry

## 2020-12-17 ENCOUNTER — Encounter (INDEPENDENT_AMBULATORY_CARE_PROVIDER_SITE_OTHER): Payer: Self-pay

## 2020-12-18 ENCOUNTER — Encounter (HOSPITAL_COMMUNITY): Payer: Self-pay | Admitting: Emergency Medicine

## 2020-12-18 ENCOUNTER — Other Ambulatory Visit: Payer: Self-pay

## 2020-12-18 ENCOUNTER — Emergency Department (HOSPITAL_COMMUNITY): Payer: BC Managed Care – PPO

## 2020-12-18 ENCOUNTER — Emergency Department (HOSPITAL_COMMUNITY)
Admission: EM | Admit: 2020-12-18 | Discharge: 2020-12-18 | Disposition: A | Discharge status: Home / Self Care | Payer: BC Managed Care – PPO | Attending: Emergency Medicine | Admitting: Emergency Medicine

## 2020-12-18 DIAGNOSIS — K529 Noninfective gastroenteritis and colitis, unspecified: Secondary | ICD-10-CM

## 2020-12-18 DIAGNOSIS — R1084 Generalized abdominal pain: Secondary | ICD-10-CM

## 2020-12-18 DIAGNOSIS — R109 Unspecified abdominal pain: Secondary | ICD-10-CM | POA: Insufficient documentation

## 2020-12-18 DIAGNOSIS — R112 Nausea with vomiting, unspecified: Secondary | ICD-10-CM | POA: Insufficient documentation

## 2020-12-18 DIAGNOSIS — R197 Diarrhea, unspecified: Secondary | ICD-10-CM | POA: Diagnosis not present

## 2020-12-18 LAB — CBC WITH DIFFERENTIAL/PLATELET
Abs Immature Granulocytes: 0.02 10*3/uL (ref 0.00–0.07)
Basophils Absolute: 0 10*3/uL (ref 0.0–0.1)
Basophils Relative: 0 %
Eosinophils Absolute: 0 10*3/uL (ref 0.0–0.5)
Eosinophils Relative: 0 %
HCT: 41.4 % (ref 36.0–46.0)
Hemoglobin: 14.5 g/dL (ref 12.0–15.0)
Immature Granulocytes: 0 %
Lymphocytes Relative: 11 %
Lymphs Abs: 0.9 10*3/uL (ref 0.7–4.0)
MCH: 33.3 pg (ref 26.0–34.0)
MCHC: 35 g/dL (ref 30.0–36.0)
MCV: 95 fL (ref 80.0–100.0)
Monocytes Absolute: 0.5 10*3/uL (ref 0.1–1.0)
Monocytes Relative: 6 %
Neutro Abs: 6.3 10*3/uL (ref 1.7–7.7)
Neutrophils Relative %: 83 %
Platelets: 271 10*3/uL (ref 150–400)
RBC: 4.36 MIL/uL (ref 3.87–5.11)
RDW: 12.1 % (ref 11.5–15.5)
WBC: 7.8 10*3/uL (ref 4.0–10.5)
nRBC: 0 % (ref 0.0–0.2)

## 2020-12-18 LAB — COMPREHENSIVE METABOLIC PANEL
ALT: 24 U/L (ref 0–44)
AST: 21 U/L (ref 15–41)
Albumin: 4.6 g/dL (ref 3.5–5.0)
Alkaline Phosphatase: 65 U/L (ref 38–126)
Anion gap: 9 (ref 5–15)
BUN: 11 mg/dL (ref 6–20)
CO2: 23 mmol/L (ref 22–32)
Calcium: 9.3 mg/dL (ref 8.9–10.3)
Chloride: 109 mmol/L (ref 98–111)
Creatinine, Ser: 0.66 mg/dL (ref 0.44–1.00)
GFR, Estimated: >60 mL/min (ref >60)
Glucose, Bld: 103 mg/dL — ABNORMAL HIGH (ref 70–99)
Potassium: 3.6 mmol/L (ref 3.5–5.1)
Sodium: 141 mmol/L (ref 135–145)
Total Bilirubin: 1.6 mg/dL — ABNORMAL HIGH (ref 0.3–1.2)
Total Protein: 7.2 g/dL (ref 6.5–8.1)

## 2020-12-18 LAB — I-STAT BETA HCG BLOOD, ED (MC, WL, AP ONLY): I-stat hCG, quantitative: <5 m[IU]/mL (ref <5)

## 2020-12-18 LAB — LIPASE, BLOOD: Lipase: 25 U/L (ref 11–51)

## 2020-12-18 MED ORDER — SODIUM CHLORIDE 0.9 % IV BOLUS
1000.0000 mL | Freq: Once | INTRAVENOUS | Status: AC
  Administered 2020-12-18: 1000 mL via INTRAVENOUS

## 2020-12-18 MED ORDER — SODIUM CHLORIDE (PF) 0.9 % IJ SOLN
INTRAMUSCULAR | Status: AC
  Filled 2020-12-18: qty 50

## 2020-12-18 MED ORDER — LOPERAMIDE HCL 2 MG PO TABS: 2.0000 mg | ORAL_TABLET | Freq: Four times a day (QID) | ORAL | 0 refills | Status: DC | PRN

## 2020-12-18 MED ORDER — ONDANSETRON 4 MG PO TBDP
4.0000 mg | ORAL_TABLET | Freq: Three times a day (TID) | ORAL | 1 refills | Status: DC | PRN
Start: 2020-12-18 — End: 2021-01-29

## 2020-12-18 MED ORDER — IOHEXOL 350 MG/ML SOLN
100.0000 mL | Freq: Once | INTRAVENOUS | Status: AC | PRN
  Administered 2020-12-18: 80 mL via INTRAVENOUS

## 2020-12-18 MED ORDER — SODIUM CHLORIDE 0.9 % IV SOLN: INTRAVENOUS | Status: DC

## 2020-12-18 NOTE — ED Triage Notes (Signed)
Patient reports that she went to Urgent Care this morning for Nausea, diarrhea and abd pain x 2 weeks.  She was referred to come to the ER for further testing.

## 2020-12-18 NOTE — ED Provider Notes (Signed)
Sonya Edwards   CSN: [id number] Arrival date & time: 12/18/20  0816     History Chief Complaint  Patient presents with   Abdominal Pain    Sonya Edwards is a 25 y.o. female.  Patient sent in from urgent care for persistent nausea vomiting diarrhea and abdominal pain.  Symptoms started on July 3.  Patient was seen at urgent care on July 7 diagnosed with gastroenteritis treated with Zofran.  And then seen again today and because of persistent symptoms referred in for further evaluation.  Patient was referred earlier to gastroenterology [facility] and has an appointment on this Tuesday.  Patient does have a family member that had Crohn's disease.  Patient has no rash no joint aches but has had some low back discomfort associated with it.  No blood with the bowel movements or vomiting.  Patient is having anywhere from 4-5 bowel movements a day and still having nausea and vomiting.  Patient denies any fevers denies any upper respiratory symptoms.      Past Medical History:  Diagnosis Date   Anxiety    Depression     There are no problems to display for this patient.   Past Surgical History:  Procedure Laterality Date   Anxiety     WISDOM TOOTH EXTRACTION Bilateral 2016     OB History     Gravida  0   Para  0   Term  0   Preterm  0   AB  0   Living  0      SAB  0   IAB  0   Ectopic  0   Multiple  0   Live Births              Family History  Problem Relation Age of Onset   Diabetes Maternal Grandmother    Thyroid disease Paternal Grandmother    Diabetes Paternal Grandfather    Stroke Paternal Grandfather     Social History   Tobacco Use   Smoking status: Never   Smokeless tobacco: Never  Vaping Use   Vaping Use: Never used  Substance Use Topics   Alcohol use: Yes    Alcohol/week: 0.0 - 1.0 standard drinks   Drug use: No    Home Medications Prior to Admission medications   Not on File     Allergies    Patient has no known allergies.  Review of Systems   Review of Systems  Constitutional:  Negative for chills and fever.  HENT:  Negative for congestion, ear pain and sore throat.   Eyes:  Negative for pain and visual disturbance.  Respiratory:  Negative for cough and shortness of breath.   Cardiovascular:  Negative for chest pain and palpitations.  Gastrointestinal:  Positive for abdominal pain, diarrhea, nausea and vomiting.  Genitourinary:  Negative for dysuria and hematuria.  Musculoskeletal:  Positive for back pain and myalgias. Negative for arthralgias, neck pain and neck stiffness.  Skin:  Negative for color change and rash.  Neurological:  Negative for seizures and syncope.  All other systems reviewed and are negative.  Physical Exam Updated Vital Signs BP 114/74 (BP Location: Left Arm)   Pulse 79   Temp 97.8 F (36.6 C) (Oral)   Resp 18   Ht 1.626 m (5\' 4" )   Wt 74.8 kg   LMP 12/13/2020   SpO2 100%   BMI 28.32 kg/m   Physical Exam Vitals and nursing Edwards reviewed.  Constitutional:      General: She is not in acute distress.    Appearance: Normal appearance. She is well-developed.  HENT:     Head: Normocephalic and atraumatic.  Eyes:     Extraocular Movements: Extraocular movements intact.     Conjunctiva/sclera: Conjunctivae normal.     Pupils: Pupils are equal, round, and reactive to light.  Cardiovascular:     Rate and Rhythm: Normal rate and regular rhythm.     Heart sounds: No murmur heard. Pulmonary:     Effort: Pulmonary effort is normal. No respiratory distress.     Breath sounds: Normal breath sounds.  Abdominal:     Palpations: Abdomen is soft.     Tenderness: There is no abdominal tenderness. There is no guarding.     Comments: Abdomen soft nontender  Musculoskeletal:     Cervical back: Neck supple.  Skin:    General: Skin is warm and dry.  Neurological:     General: No focal deficit present.     Mental Status: She is  alert and oriented to person, place, and time.    ED Results / Procedures / Treatments   Labs (all labs ordered are listed, but only abnormal results are displayed) Labs Reviewed - No data to display  EKG None  Radiology No results found.  Procedures Procedures   Medications Ordered in ED Medications - No data to display  ED Course  I have reviewed the triage vital signs and the nursing notes.  Pertinent labs & imaging results that were available during my care of the patient were reviewed by me and considered in my medical decision making (see chart for details).    MDM Rules/Calculators/A&P                          Patient's abdomen is soft and nontender.  Patient appears nontoxic.  Will get abdominal labs and CT abdomen and pelvis to evaluate for colitis or inflammatory bowel disorder.  In addition we will give IV fluids and antinausea medicine.  Labs and CT scan of the abdomen without any acute abnormalities.  Patient without any evidence of any significant dehydration.  Will have patient follow-up with her [facility] gastroenterologist specialist appointment scheduled for Tuesday.  Treat her with Zofran and Imodium A-D.  Work Edwards provided.  Additional work-up is important to evaluate for malabsorption syndromes or other possible causes and probably needs a colonoscopy.  Still possible symptoms could be just related to a viral illness since they started on July 3. Final Clinical Impression(s) / ED Diagnoses Final diagnoses:  None    Rx / DC Orders ED Discharge Orders     None        Fredia Sorrow, MD 12/18/20 1349

## 2020-12-18 NOTE — Discharge Instructions (Signed)
Follow-up with [facility] GI as you have scheduled for Tuesday.  Work note provided to be out of work through Wednesday.  Take Zofran as needed for nausea and vomiting.  Take the Imodium A-D as needed for diarrhea.  Today's work-up to include labs and CT scan of the abdomen without any acute findings.  Which was reassuring.  But gastroenterology will evaluate further to try to find the cause of your symptoms.  Still possible that it could be viral in nature since symptoms just started on July 3.

## 2021-01-15 ENCOUNTER — Telehealth: Payer: Self-pay | Admitting: *Deleted

## 2021-01-15 NOTE — Telephone Encounter (Signed)
Patient called c/o nausea and vomiting x 1 month, reports she is seeing her GI MD to address this issues. She also mentioned that her cycle start on 01/13/21 and inserting/removing a tampon is very painful and the tampon when inserted is pushing to the left side which is not normal for her, she asked what to do. I suggested patient schedule OV with provider for exam, and to stop using tampons if painful and use pad while on cycle. Patient verbalized she understood. She reports several negative UPT's and has not been sexually active recently. I told patient I would have appointments send her a message to schedule OV.

## 2021-01-27 ENCOUNTER — Other Ambulatory Visit: Payer: Self-pay | Admitting: Obstetrics and Gynecology

## 2021-01-28 NOTE — Telephone Encounter (Signed)
Last well woman exam 04/12/2019 Last OV 04/19/2020

## 2021-01-29 ENCOUNTER — Ambulatory Visit: Payer: BC Managed Care – PPO | Admitting: Obstetrics and Gynecology

## 2021-01-29 ENCOUNTER — Encounter: Payer: Self-pay | Admitting: Obstetrics and Gynecology

## 2021-01-29 ENCOUNTER — Other Ambulatory Visit: Payer: Self-pay

## 2021-01-29 VITALS — BP 100/62 | HR 77 | Ht 64.0 in | Wt 149.0 lb

## 2021-01-29 DIAGNOSIS — R110 Nausea: Secondary | ICD-10-CM | POA: Diagnosis not present

## 2021-01-29 DIAGNOSIS — F419 Anxiety disorder, unspecified: Secondary | ICD-10-CM | POA: Diagnosis not present

## 2021-01-29 DIAGNOSIS — R102 Pelvic and perineal pain: Secondary | ICD-10-CM | POA: Diagnosis not present

## 2021-01-29 LAB — PREGNANCY, URINE: Preg Test, Ur: NEGATIVE

## 2021-01-29 NOTE — Progress Notes (Signed)
GYNECOLOGY  VISIT   HPI: 25 y.o.   Single White or Caucasian Not Hispanic or Latino  female   G0P0000 with Patient's last menstrual period was 01/13/2021.   here for Patient states that she inserted a tampon on her last period it felt like something was blocking it  from going straight in. She says that the tampon had to take a sharp left turn to go in. She says that she was sexually active  the other day for the first time in two months. She says that the sex was not painful but after sex she had pain in her lower abdomen.  No baseline pain and no abnormal vaginal discharge.   She has been seeing a GI specialist for nausea, emesis, diarrhea in the last 2 month. Negative pregnancy test.  The diarrhea is better, the emesis has improved, still with bad nausea. Negative CT in 7/22.   She has long term issues with anxiety. She wakes up with her heart racing and with severe nausea. She is following up with GI next week.   She is with the same long partner. Using w/d for contraception.   She is having normal, monthly cycles. LMP 01/13/21.   GYNECOLOGIC HISTORY: Patient's last menstrual period was 01/13/2021. Contraception:none  Menopausal hormone therapy: none         OB History     Gravida  0   Para  0   Term  0   Preterm  0   AB  0   Living  0      SAB  0   IAB  0   Ectopic  0   Multiple  0   Live Births                 There are no problems to display for this patient.   Past Medical History:  Diagnosis Date   Anxiety    Depression     Past Surgical History:  Procedure Laterality Date   Anxiety     WISDOM TOOTH EXTRACTION Bilateral 2016    Current Outpatient Medications  Medication Sig Dispense Refill   ulipristal acetate (ELLA) 30 MG tablet TAKE 1 TABLET(30 MG) BY MOUTH 1 TIME FOR 1 DOSE 1 tablet 1   omeprazole (PRILOSEC) 40 MG capsule Take 40 mg by mouth daily.     No current facility-administered medications for this visit.     ALLERGIES:  Patient has no known allergies.  Family History  Problem Relation Age of Onset   Diabetes Maternal Grandmother    Thyroid disease Paternal Grandmother    Diabetes Paternal Grandfather    Stroke Paternal Grandfather     Social History   Socioeconomic History   Marital status: Single    Spouse name: Not on file   Number of children: Not on file   Years of education: Not on file   Highest education level: Not on file  Occupational History   Not on file  Tobacco Use   Smoking status: Never   Smokeless tobacco: Never  Vaping Use   Vaping Use: Never used  Substance and Sexual Activity   Alcohol use: Yes    Alcohol/week: 0.0 - 1.0 standard drinks   Drug use: No   Sexual activity: Yes    Partners: Male    Birth control/protection: None  Other Topics Concern   Not on file  Social History Narrative   Not on file   Social Determinants of Radio broadcast assistant  Strain: Not on file  Food Insecurity: Not on file  Transportation Needs: Not on file  Physical Activity: Not on file  Stress: Not on file  Social Connections: Not on file  Intimate Partner Violence: Not on file    Review of Systems  All other systems reviewed and are negative.  PHYSICAL EXAMINATION:    BP 100/62   Pulse 77   Ht '5\' 4"'$  (1.626 m)   Wt 149 lb (67.6 kg)   LMP 01/13/2021   SpO2 99%   BMI 25.58 kg/m     General appearance: alert, cooperative and appears stated age Abdomen: soft, non-tender; non distended, no masses,  no organomegaly  Pelvic: External genitalia:  no lesions              Urethra:  normal appearing urethra with no masses, tenderness or lesions              Bartholins and Skenes: normal                 Vagina: normal appearing vagina with normal color and discharge, no lesions              Cervix: no cervical motion tenderness and no lesions              Bimanual Exam:  Uterus:  normal size, contour, position, consistency, mobility, non-tender and anteverted               Adnexa: no mass, fullness, tenderness               Chaperone was present for exam.  1. Pelvic pain Occurred one time after sex. No baseline pain. She also had trouble inserting a tampon x 1. Normal exam, patient reassured  2. Nausea Following up with GI - Pregnancy, urine  3. Anxiety Discussed the option of trying medication Name of counselor given

## 2021-02-26 ENCOUNTER — Ambulatory Visit: Payer: BC Managed Care – PPO | Admitting: Obstetrics and Gynecology

## 2021-04-17 ENCOUNTER — Ambulatory Visit: Payer: BC Managed Care – PPO | Admitting: Obstetrics and Gynecology

## 2021-04-17 ENCOUNTER — Encounter: Payer: Self-pay | Admitting: Obstetrics and Gynecology

## 2021-04-17 ENCOUNTER — Other Ambulatory Visit: Payer: Self-pay

## 2021-04-17 VITALS — BP 122/84 | HR 72 | Ht 64.0 in | Wt 159.0 lb

## 2021-04-17 DIAGNOSIS — N760 Acute vaginitis: Secondary | ICD-10-CM

## 2021-04-17 DIAGNOSIS — B3731 Acute candidiasis of vulva and vagina: Secondary | ICD-10-CM | POA: Diagnosis not present

## 2021-04-17 LAB — WET PREP FOR TRICH, YEAST, CLUE

## 2021-04-17 MED ORDER — FLUCONAZOLE 150 MG PO TABS: 150.0000 mg | ORAL_TABLET | Freq: Once | ORAL | 0 refills | Status: AC

## 2021-04-17 MED ORDER — BETAMETHASONE VALERATE 0.1 % EX OINT: 1.0000 "application " | TOPICAL_OINTMENT | Freq: Two times a day (BID) | CUTANEOUS | 0 refills | Status: DC

## 2021-04-17 NOTE — Patient Instructions (Signed)

## 2021-04-17 NOTE — Progress Notes (Signed)
GYNECOLOGY  VISIT   HPI: 25 y.o.   Single White or Caucasian Not Hispanic or Latino  female   G0P0000 with Patient's last menstrual period was 03/22/2021.   here for vulvar itching that started about 3 days ago. She is also having discharge and irritation.    One episode of external dysuria. No frequency, no urgency to void.   GYNECOLOGIC HISTORY: Patient's last menstrual period was 03/22/2021. Contraception:none  Menopausal hormone therapy: none         OB History     Gravida  0   Para  0   Term  0   Preterm  0   AB  0   Living  0      SAB  0   IAB  0   Ectopic  0   Multiple  0   Live Births                 There are no problems to display for this patient.   Past Medical History:  Diagnosis Date   Anxiety    Depression     Past Surgical History:  Procedure Laterality Date   Anxiety     WISDOM TOOTH EXTRACTION Bilateral 2016    Current Outpatient Medications  Medication Sig Dispense Refill   amitriptyline (ELAVIL) 25 MG tablet Take 25 mg by mouth at bedtime.     omeprazole (PRILOSEC) 40 MG capsule Take 40 mg by mouth daily.     No current facility-administered medications for this visit.     ALLERGIES: Patient has no known allergies.  Family History  Problem Relation Age of Onset   Diabetes Maternal Grandmother    Thyroid disease Paternal Grandmother    Diabetes Paternal Grandfather    Stroke Paternal Grandfather     Social History   Socioeconomic History   Marital status: Single    Spouse name: Not on file   Number of children: Not on file   Years of education: Not on file   Highest education level: Not on file  Occupational History   Not on file  Tobacco Use   Smoking status: Never   Smokeless tobacco: Never  Vaping Use   Vaping Use: Never used  Substance and Sexual Activity   Alcohol use: Yes    Alcohol/week: 0.0 - 1.0 standard drinks   Drug use: No   Sexual activity: Yes    Partners: Male    Birth  control/protection: None  Other Topics Concern   Not on file  Social History Narrative   Not on file   Social Determinants of Health   Financial Resource Strain: Not on file  Food Insecurity: Not on file  Transportation Needs: Not on file  Physical Activity: Not on file  Stress: Not on file  Social Connections: Not on file  Intimate Partner Violence: Not on file    Review of Systems  All other systems reviewed and are negative.  PHYSICAL EXAMINATION:    BP 122/84   Pulse 72   Ht 5\' 4"  (1.626 m)   Wt 159 lb (72.1 kg)   LMP 03/22/2021   SpO2 100%   BMI 27.29 kg/m     General appearance: alert, cooperative and appears stated age  Pelvic: External genitalia:  no lesions, mild erythema              Urethra:  normal appearing urethra with no masses, tenderness or lesions  Bartholins and Skenes: normal                 Vagina: erythematous appearing vagina, minimal creamy, white vaginal discharge              Cervix: no lesions              1. Acute vaginitis - WET PREP FOR TRICH, YEAST, CLUE - betamethasone valerate ointment (VALISONE) 0.1 %; Apply 1 application topically 2 (two) times daily. Can use for up to 2 weeks as needed  Dispense: 30 g; Refill: 0  2. Yeast vaginitis - fluconazole (DIFLUCAN) 150 MG tablet; Take 1 tablet (150 mg total) by mouth once for 1 dose. Take one tablet.  Repeat in 72 hours if symptoms are not completely resolved.  Dispense: 2 tablet; Refill: 0

## 2021-06-05 ENCOUNTER — Ambulatory Visit: Payer: BC Managed Care – PPO | Admitting: Obstetrics and Gynecology

## 2021-06-10 ENCOUNTER — Ambulatory Visit: Payer: BC Managed Care – PPO | Admitting: Obstetrics and Gynecology

## 2021-06-17 ENCOUNTER — Ambulatory Visit: Payer: BC Managed Care – PPO | Admitting: Obstetrics and Gynecology

## 2021-08-07 NOTE — Progress Notes (Signed)
26 y.o. G0P0000 Single White or Caucasian Not Hispanic or Latino female here for annual exam.  Lives with her partner, not recently sexually active, low libido.  ?Period Cycle (Days): 28 ?Period Duration (Days): 5 ?Period Pattern: Regular ?Menstrual Flow: Light ?Menstrual Control: Panty liner, Tampon ?Menstrual Control Change Freq (Hours): 6 ?Dysmenorrhea: (!) Moderate ?Dysmenorrhea Symptoms: Cramping, Diarrhea ? ?She is on amitriptyline for anxiety, working well.  ? ?Patient's last menstrual period was 08/03/2021.          ?Sexually active: No.  ?The current method of family planning is abstinence.    ?Exercising: Yes.     Weights and cardio  ?Smoker:  no ? ?Health Maintenance: ?Pap:  03/27/18 WNL  ?History of abnormal Pap:  no ?MMG:  none  ?BMD:   none  ?Colonoscopy: none  ?TDaP:  04/12/19  ?Gardasil: completed per patient  ? ? reports that she has never smoked. She has never used smokeless tobacco. She reports current alcohol use. She reports that she does not use drugs. Just occasional ETOH. 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  ? ?Past Medical History:  ?Diagnosis Date  ? Anxiety   ? Depression   ? ? ?Past Surgical History:  ?Procedure Laterality Date  ? Anxiety    ? WISDOM TOOTH EXTRACTION Bilateral 2016  ? ? ?Current Outpatient Medications  ?Medication Sig Dispense Refill  ? amitriptyline (ELAVIL) 25 MG tablet Take 25 mg by mouth at bedtime.    ? omeprazole (PRILOSEC) 40 MG capsule Take 40 mg by mouth daily.    ? ?No current facility-administered medications for this visit.  ? ? ?Family History  ?Problem Relation Age of Onset  ? Diabetes Maternal Grandmother   ? Thyroid disease Paternal Grandmother   ? Diabetes Paternal Grandfather   ? Stroke Paternal Grandfather   ? ? ?Review of Systems  ?All other systems reviewed and are negative. ? ?Exam:   ?BP 110/72   Pulse 75   Ht 5\' 4"  (1.626 m)   Wt 155 lb (70.3 kg)   LMP 08/03/2021   SpO2 100%   BMI 26.61 kg/m?   Weight change: @WEIGHTCHANGE @ Height:    Height: 5\' 4"  (162.6 cm)  ?Ht Readings from Last 3 Encounters:  ?08/13/21 5\' 4"  (1.626 m)  ?04/17/21 5\' 4"  (1.626 m)  ?01/29/21 5\' 4"  (1.626 m)  ? ? ?General appearance: alert, cooperative and appears stated age ?Head: Normocephalic, without obvious abnormality, atraumatic ?Neck: no adenopathy, supple, symmetrical, trachea midline and thyroid normal to inspection and palpation ?Lungs: clear to auscultation bilaterally ?Cardiovascular: regular rate and rhythm ?Breasts: normal appearance, no masses or tenderness ?Abdomen: soft, non-tender; non distended,  no masses,  no organomegaly ?Extremities: extremities normal, atraumatic, no cyanosis or edema ?Skin: Skin color, texture, turgor normal. No rashes or lesions ?Lymph nodes: Cervical, supraclavicular, and axillary nodes normal. ?No abnormal inguinal nodes palpated ?Neurologic: Grossly normal ? ? ?Pelvic: External genitalia:  no lesions ?             Urethra:  normal appearing urethra with no masses, tenderness or lesions ?             Bartholins and Skenes: normal    ?             Vagina: normal appearing vagina with normal color and discharge, no lesions ?             Cervix: no lesions ?              ?  Bimanual Exam:  Uterus:  normal size, contour, position, consistency, mobility, non-tender and anteverted ?             Adnexa: no mass, fullness, tenderness ?              Rectovaginal: Confirms ?              Anus:  normal sphincter tone, no lesions ? ?Gae Dry chaperoned for the exam. ? ?1. Well woman exam ?Discussed breast self exam ?Discussed calcium and vit D intake ?No blood work today ? ?2. Screening for cervical cancer ?- Cytology - PAP ? ?3. Screening examination for STD (sexually transmitted disease) ?- SURESWAB CT/NG/T. Vaginalis ?-Declines blood work ? ? ? ?

## 2021-08-12 ENCOUNTER — Encounter: Payer: Self-pay | Admitting: Podiatry

## 2021-08-12 ENCOUNTER — Other Ambulatory Visit: Payer: Self-pay

## 2021-08-12 ENCOUNTER — Ambulatory Visit: Payer: BC Managed Care – PPO

## 2021-08-12 ENCOUNTER — Ambulatory Visit: Payer: BC Managed Care – PPO | Admitting: Podiatry

## 2021-08-12 DIAGNOSIS — D2371 Other benign neoplasm of skin of right lower limb, including hip: Secondary | ICD-10-CM | POA: Diagnosis not present

## 2021-08-12 NOTE — Progress Notes (Signed)
She presents today after having seen Dr. March Rummage nearly this time last year with a chief complaint of a painful lesion beneath the fifth metatarsal head of the right foot.  States that his small callused area to Dr. March Rummage put acid on last time and it helped until just recently. ? ?Objective: Vital signs are stable alert and oriented x3.  Pulses are palpable.  There is a small area of reactive hyperkeratosis overlying the plantar aspect of the head of the fifth metatarsal of the right foot.  There is a small porokeratotic lesion there that is benign in nature. ? ?Assessment: Benign skin lesion.  Subfifth met head pain. ? ?Plan: Mechanical and chemical debridement with Salinocaine and covered to be left on for 3 days without getting wet patient will follow-up with [place] on an as-needed basis should this not completely resolve ?

## 2021-08-13 ENCOUNTER — Encounter: Payer: Self-pay | Admitting: Obstetrics and Gynecology

## 2021-08-13 ENCOUNTER — Other Ambulatory Visit (HOSPITAL_COMMUNITY)
Admission: RE | Admit: 2021-08-13 | Discharge: 2021-08-13 | Disposition: A | Discharge status: Home / Self Care | Payer: BC Managed Care – PPO | Source: Ambulatory Visit | Attending: Obstetrics and Gynecology | Admitting: Obstetrics and Gynecology

## 2021-08-13 ENCOUNTER — Ambulatory Visit (INDEPENDENT_AMBULATORY_CARE_PROVIDER_SITE_OTHER): Payer: BC Managed Care – PPO | Admitting: Obstetrics and Gynecology

## 2021-08-13 VITALS — BP 110/72 | HR 75 | Ht 64.0 in | Wt 155.0 lb

## 2021-08-13 DIAGNOSIS — Z124 Encounter for screening for malignant neoplasm of cervix: Secondary | ICD-10-CM

## 2021-08-13 DIAGNOSIS — Z113 Encounter for screening for infections with a predominantly sexual mode of transmission: Secondary | ICD-10-CM

## 2021-08-13 DIAGNOSIS — Z01419 Encounter for gynecological examination (general) (routine) without abnormal findings: Secondary | ICD-10-CM | POA: Diagnosis not present

## 2021-08-13 NOTE — Patient Instructions (Signed)

## 2021-08-14 LAB — SURESWAB CT/NG/T. VAGINALIS
C. trachomatis RNA, TMA: NOT DETECTED
N. gonorrhoeae RNA, TMA: NOT DETECTED
Trichomonas vaginalis RNA: NOT DETECTED

## 2021-08-15 LAB — CYTOLOGY - PAP: Diagnosis: NEGATIVE

## 2021-08-25 ENCOUNTER — Encounter: Payer: Self-pay | Admitting: Obstetrics and Gynecology

## 2022-01-23 ENCOUNTER — Encounter: Payer: Self-pay | Admitting: Obstetrics and Gynecology

## 2022-01-26 ENCOUNTER — Other Ambulatory Visit: Payer: Self-pay

## 2022-01-26 ENCOUNTER — Other Ambulatory Visit: Payer: BC Managed Care – PPO

## 2022-01-26 DIAGNOSIS — N926 Irregular menstruation, unspecified: Secondary | ICD-10-CM

## 2022-01-27 LAB — HCG, QUANTITATIVE, PREGNANCY: HCG, Total, QN: <5 m[IU]/mL
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
## 2024-01-21 ENCOUNTER — Ambulatory Visit: Admitting: Podiatry

## 2024-01-25 ENCOUNTER — Encounter: Payer: Self-pay | Admitting: Podiatry

## 2024-01-25 ENCOUNTER — Ambulatory Visit: Admitting: Podiatry

## 2024-01-25 ENCOUNTER — Ambulatory Visit

## 2024-01-25 DIAGNOSIS — D2371 Other benign neoplasm of skin of right lower limb, including hip: Secondary | ICD-10-CM

## 2024-01-25 DIAGNOSIS — M79671 Pain in right foot: Secondary | ICD-10-CM | POA: Diagnosis not present

## 2024-01-25 DIAGNOSIS — M21961 Unspecified acquired deformity of right lower leg: Secondary | ICD-10-CM

## 2024-01-25 NOTE — Progress Notes (Unsigned)
  Chief Complaint  Patient presents with   Foot Pain     R fifth meta.  2022 first time it came up, again 2023, then 8 months ago.  Sharp pain from area.  Non diabetic. No anti coag.   HPI: 28 y.o. female presents today with concern of a painful skin lesion on the bottom of the right foot near the fifth toe.  States that this has been an ongoing issue for years.  Denies stepping on foreign object and denies any drainage  Past Medical History:  Diagnosis Date   Anxiety    Depression    Past Surgical History:  Procedure Laterality Date   Anxiety     WISDOM TOOTH EXTRACTION Bilateral 2016   No Known Allergies    Physical Exam: Palpable pedal pulses.  There is a focal hyperkeratotic lesion on the plantar aspect of the right fifth metatarsal head.  There is pain on palpation of the lesion.  No surrounding erythema or evidence of foreign body is noted.  There is a slightly plantarflexed fifth metatarsal on that foot.  Adductovarus 4th and 5th toe deformities.  Epicritic sensation is intact.  Radiographic Exam (right foot, 3 weightbearing views, 01/25/2024):  Normal osseous mineralization. No fractures noted.  There is medial angulation of the 4th and 5th toes at the level of the DIPJ.  There are contractures of the lesser toes at the DIPJ.  Short fifth metatarsal.  No significant bone spur noted.  No foreign body noted  Assessment/Plan of Care: 1. Benign neoplasm of skin of right foot   2. 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  She can remove this in 4 to 6 hours.  May expect blistering tomorrow.  Offloading pads were dispensed.  Discussed possible need for custom orthotics with an accommodation at the fifth metatarsal head to offload this bone and decrease pressure.  Will see how she responds with this  treatment.  Follow-up 1 month   Nolita Kutter D. Laterra Lubinski, DPM, FACFAS [facility]     [address] [place], [place] [address]                Office [phone]  Fax [phone]

## 2024-02-29 ENCOUNTER — Ambulatory Visit: Admitting: Podiatry
